# Patient Record
Sex: Female | Born: 1965 | Hispanic: No | Marital: Married | Smoking: Never smoker
Health system: Southern US, Community
[De-identification: ages and names within clinical notes are randomized; demographics above are authoritative.]

## PROBLEM LIST (undated history)

## (undated) DIAGNOSIS — E119 Type 2 diabetes mellitus without complications: Secondary | ICD-10-CM

## (undated) DIAGNOSIS — R569 Unspecified convulsions: Secondary | ICD-10-CM

## (undated) DIAGNOSIS — D649 Anemia, unspecified: Secondary | ICD-10-CM

## (undated) DIAGNOSIS — E785 Hyperlipidemia, unspecified: Secondary | ICD-10-CM

## (undated) DIAGNOSIS — T7840XA Allergy, unspecified, initial encounter: Secondary | ICD-10-CM

## (undated) DIAGNOSIS — F329 Major depressive disorder, single episode, unspecified: Secondary | ICD-10-CM

## (undated) DIAGNOSIS — F32A Depression, unspecified: Secondary | ICD-10-CM

## (undated) HISTORY — PX: NO PAST SURGERIES: SHX2092

## (undated) HISTORY — DX: Type 2 diabetes mellitus without complications: E11.9

## (undated) HISTORY — DX: Hyperlipidemia, unspecified: E78.5

## (undated) HISTORY — DX: Allergy, unspecified, initial encounter: T78.40XA

## (undated) HISTORY — DX: Major depressive disorder, single episode, unspecified: F32.9

## (undated) HISTORY — DX: Unspecified convulsions: R56.9

## (undated) HISTORY — DX: Anemia, unspecified: D64.9

## (undated) HISTORY — DX: Depression, unspecified: F32.A

---

## 1998-06-19 ENCOUNTER — Encounter: Payer: Self-pay | Admitting: *Deleted

## 1998-06-19 ENCOUNTER — Ambulatory Visit (HOSPITAL_COMMUNITY): Admission: RE | Admit: 1998-06-19 | Discharge: 1998-06-19 | Payer: Self-pay | Admitting: *Deleted

## 2000-01-02 ENCOUNTER — Emergency Department (HOSPITAL_COMMUNITY): Admission: EM | Admit: 2000-01-02 | Discharge: 2000-01-02 | Payer: Self-pay | Admitting: Emergency Medicine

## 2000-01-02 ENCOUNTER — Encounter: Payer: Self-pay | Admitting: Emergency Medicine

## 2012-02-07 ENCOUNTER — Telehealth: Payer: Self-pay

## 2012-02-07 ENCOUNTER — Other Ambulatory Visit: Payer: Self-pay | Admitting: Family Medicine

## 2012-02-07 ENCOUNTER — Encounter: Payer: Self-pay | Admitting: Family Medicine

## 2012-02-07 ENCOUNTER — Ambulatory Visit (INDEPENDENT_AMBULATORY_CARE_PROVIDER_SITE_OTHER): Payer: BC Managed Care – PPO | Admitting: Family Medicine

## 2012-02-07 VITALS — BP 114/64 | HR 78 | Temp 98.1°F | Resp 16 | Ht 61.5 in | Wt 115.4 lb

## 2012-02-07 DIAGNOSIS — Z0000 Encounter for general adult medical examination without abnormal findings: Secondary | ICD-10-CM

## 2012-02-07 LAB — POCT UA - MICROSCOPIC ONLY
Bacteria, U Microscopic: NEGATIVE
Casts, Ur, LPF, POC: NEGATIVE
Crystals, Ur, HPF, POC: NEGATIVE
Mucus, UA: NEGATIVE
WBC, Ur, HPF, POC: NEGATIVE
Yeast, UA: NEGATIVE

## 2012-02-07 LAB — COMPREHENSIVE METABOLIC PANEL
ALT: 13 U/L (ref 0–35)
AST: 16 U/L (ref 0–37)
Albumin: 4.7 g/dL (ref 3.5–5.2)
Alkaline Phosphatase: 38 U/L — ABNORMAL LOW (ref 39–117)
BUN: 12 mg/dL (ref 6–23)
CO2: 25 mEq/L (ref 19–32)
Calcium: 9.7 mg/dL (ref 8.4–10.5)
Chloride: 101 mEq/L (ref 96–112)
Creat: 0.59 mg/dL (ref 0.50–1.10)
Glucose, Bld: 98 mg/dL (ref 70–99)
Potassium: 4.3 mEq/L (ref 3.5–5.3)
Sodium: 137 mEq/L (ref 135–145)
Total Bilirubin: 0.6 mg/dL (ref 0.3–1.2)
Total Protein: 8.2 g/dL (ref 6.0–8.3)

## 2012-02-07 LAB — LIPID PANEL
Cholesterol: 195 mg/dL (ref 0–200)
HDL: 51 mg/dL (ref >39)
LDL Cholesterol: 116 mg/dL — ABNORMAL HIGH (ref 0–99)
Total CHOL/HDL Ratio: 3.8 Ratio
Triglycerides: 139 mg/dL (ref <150)
VLDL: 28 mg/dL (ref 0–40)

## 2012-02-07 LAB — POCT CBC
Granulocyte percent: 47.6 %G (ref 37–80)
HCT, POC: 42.5 % (ref 37.7–47.9)
Hemoglobin: 12.6 g/dL (ref 12.2–16.2)
Lymph, poc: 2.7 (ref 0.6–3.4)
MCH, POC: 20.8 pg — AB (ref 27–31.2)
MCHC: 29.6 g/dL — AB (ref 31.8–35.4)
MCV: 70 fL — AB (ref 80–97)
MID (cbc): 0.4 (ref 0–0.9)
MPV: 7.8 fL (ref 0–99.8)
POC Granulocyte: 2.8 (ref 2–6.9)
POC LYMPH PERCENT: 45 %L (ref 10–50)
POC MID %: 7.4 %M (ref 0–12)
Platelet Count, POC: 390 10*3/uL (ref 142–424)
RBC: 6.07 M/uL — AB (ref 4.04–5.48)
RDW, POC: 14.9 %
WBC: 5.9 10*3/uL (ref 4.6–10.2)

## 2012-02-07 LAB — POCT URINALYSIS DIPSTICK
Bilirubin, UA: NEGATIVE
Glucose, UA: NEGATIVE
Ketones, UA: NEGATIVE
Leukocytes, UA: NEGATIVE
Nitrite, UA: NEGATIVE
Protein, UA: NEGATIVE
Spec Grav, UA: 1.015
Urobilinogen, UA: 0.2
pH, UA: 6

## 2012-02-07 LAB — VITAMIN B12: Vitamin B-12: 671 pg/mL (ref 211–911)

## 2012-02-07 LAB — TSH: TSH: 1.82 u[IU]/mL (ref 0.350–4.500)

## 2012-02-07 LAB — FOLATE: Folate: >20 ng/mL

## 2012-02-07 NOTE — Telephone Encounter (Signed)
Message copied by Johnnette Litter on Mon Feb 07, 2012  3:49 PM ------      Message from: Abbe Amsterdam C      Created: Mon Feb 07, 2012  3:42 PM       Can we please add a TSH, B12 and folate (microcytosis).  Thanks!

## 2012-02-07 NOTE — Progress Notes (Signed)
[facility] [address], [place] [address] [id number] [phone]  Date:  02/07/2012   Name:  Jodi Parker   DOB:  1966-06-17   MRN:  [id number]  PCP:  Tally Due, MD    Chief Complaint: Annual Exam   History of Present Illness:  Jodi Parker is a 46 y.o. very pleasant female patient who presents with the following:  Here today for a CPE.  She had a negative pap last year- no history of abnormal pap in the past.  She is here today with her adult daughter.  Her menses are regular, and she has no other concerns today.  See pink sheet.  She believes that she is UTD on her immunizations.  Mammogram last year- ok.    There is no problem list on file for this patient.   No past medical history on file.  No past surgical history on file.  History  Substance Use Topics  . Smoking status: Not on file  . Smokeless tobacco: Not on file  . Alcohol Use: Not on file    No family history on file.  No Known Allergies  Medication list has been reviewed and updated.  No current outpatient prescriptions on file prior to visit.    Review of Systems:  As per HPI- otherwise negative.   Physical Examination: Filed Vitals:   02/07/12 0824  BP: 114/64  Pulse: 78  Temp: 98.1 F (36.7 C)  Resp: 16   Filed Vitals:   02/07/12 0824  Height: 5' 1.5" (1.562 m)  Weight: 115 lb 6.4 oz (52.345 kg)   Body mass index is 21.45 kg/(m^2). Ideal Body Weight: Weight in (lb) to have BMI = 25: 134.2   GEN: WDWN, NAD, Non-toxic, A & O x 3 HEENT: Atraumatic, Normocephalic. Neck supple. No masses, No LAD.  TM and oropharynx wnl, PEERL, EOMI Ears and Nose: No external deformity. CV: RRR, No M/G/R. No JVD. No thrill. No extra heart sounds. PULM: CTA B, no wheezes, crackles, rhonchi. No retractions. No resp. distress. No accessory muscle use. Breast exam: wnl bilaterally, no masses or discharge ABD: S, NT, ND, +BS. No rebound. No HSM. EXTR: No c/c/e NEURO Normal gait.  Normal  DTR.   PSYCH: Normally interactive. Conversant. Not depressed or anxious appearing.  Calm demeanor.   Results for orders placed in visit on 02/07/12  POCT CBC      Component Value Range   WBC 5.9  4.6 - 10.2 K/uL   Lymph, poc 2.7  0.6 - 3.4   POC LYMPH PERCENT 45.0  10 - 50 %L   MID (cbc) 0.4  0 - 0.9   POC MID % 7.4  0 - 12 %M   POC Granulocyte 2.8  2 - 6.9   Granulocyte percent 47.6  37 - 80 %G   RBC 6.07 (*) 4.04 - 5.48 M/uL   Hemoglobin 12.6  12.2 - 16.2 g/dL   HCT, POC 29.5  62.1 - 47.9 %   MCV 70.0 (*) 80 - 97 fL   MCH, POC 20.8 (*) 27 - 31.2 pg   MCHC 29.6 (*) 31.8 - 35.4 g/dL   RDW, POC 30.8     Platelet Count, POC 390  142 - 424 K/uL   MPV 7.8  0 - 99.8 fL  POCT URINALYSIS DIPSTICK      Component Value Range   Color, UA yellow     Clarity, UA clear  Glucose, UA neg     Bilirubin, UA neg     Ketones, UA neg     Spec Grav, UA 1.015     Blood, UA trace-lysed     pH, UA 6.0     Protein, UA neg     Urobilinogen, UA 0.2     Nitrite, UA neg     Leukocytes, UA Negative    POCT UA - MICROSCOPIC ONLY      Component Value Range   WBC, Ur, HPF, POC neg     RBC, urine, microscopic 0-1     Bacteria, U Microscopic neg     Mucus, UA neg     Epithelial cells, urine per micros 0-1     Crystals, Ur, HPF, POC neg     Casts, Ur, LPF, POC neg     Yeast, UA neg      Assessment and Plan: 1. Physical exam, annual  POCT CBC, Comprehensive metabolic panel, Lipid panel, POCT urinalysis dipstick, POCT UA - Microscopic Only   CPE as above.  Await other labs for further follow- up.  Went over updated guidelines regarding pap frequency.  She will not have a pap this year.  Continue exercise and regular mammograms.   Caelen does not need contraception- she does not wish to use any at this time.    Abbe Amsterdam, MD

## 2012-02-07 NOTE — Telephone Encounter (Signed)
Spoke with Annice Pih at [facility]. Tests added.

## 2012-02-08 ENCOUNTER — Telehealth: Payer: Self-pay | Admitting: Radiology

## 2012-02-08 ENCOUNTER — Encounter: Payer: Self-pay | Admitting: Family Medicine

## 2012-02-08 LAB — FERRITIN: Ferritin: 47 ng/mL (ref 10–291)

## 2012-02-08 NOTE — Telephone Encounter (Signed)
Message copied by Marinus Maw on Tue Feb 08, 2012  1:48 PM ------      Message from: Abbe Amsterdam C      Created: Tue Feb 08, 2012  1:40 PM       Hi- I meant to order a ferritin, not a B12 and folate on this patient.  If it possible to doctor bill the B12 and folate? I do not want Ms. Hoheisel to have to pay for these.             Thanks so much            JC

## 2012-02-08 NOTE — Telephone Encounter (Signed)
Thanks a lot Tracy!

## 2012-02-08 NOTE — Telephone Encounter (Signed)
Spoke with Bonita Quin at [facility] and added on Ferritin.  Spoke with Billing Dept and had B12 and Folate Doctor Billed.

## 2012-02-10 ENCOUNTER — Encounter: Payer: Self-pay | Admitting: Family Medicine

## 2012-03-03 ENCOUNTER — Encounter: Payer: Self-pay | Admitting: Family Medicine

## 2013-04-20 ENCOUNTER — Ambulatory Visit (INDEPENDENT_AMBULATORY_CARE_PROVIDER_SITE_OTHER): Payer: BC Managed Care – PPO | Admitting: Internal Medicine

## 2013-04-20 ENCOUNTER — Other Ambulatory Visit: Payer: Self-pay | Admitting: Internal Medicine

## 2013-04-20 VITALS — BP 100/60 | HR 74 | Temp 97.4°F | Resp 16 | Ht 61.5 in | Wt 119.0 lb

## 2013-04-20 DIAGNOSIS — Z01419 Encounter for gynecological examination (general) (routine) without abnormal findings: Secondary | ICD-10-CM

## 2013-04-20 DIAGNOSIS — Z0000 Encounter for general adult medical examination without abnormal findings: Secondary | ICD-10-CM

## 2013-04-20 DIAGNOSIS — Z789 Other specified health status: Secondary | ICD-10-CM

## 2013-04-20 DIAGNOSIS — Z609 Problem related to social environment, unspecified: Secondary | ICD-10-CM

## 2013-04-20 LAB — POCT URINALYSIS DIPSTICK
Bilirubin, UA: NEGATIVE
Glucose, UA: NEGATIVE
Ketones, UA: NEGATIVE
Leukocytes, UA: NEGATIVE
Nitrite, UA: NEGATIVE

## 2013-04-20 LAB — COMPREHENSIVE METABOLIC PANEL WITH GFR
ALT: 18 U/L (ref 0–35)
AST: 18 U/L (ref 0–37)
Albumin: 4.5 g/dL (ref 3.5–5.2)
Alkaline Phosphatase: 47 U/L (ref 39–117)
BUN: 12 mg/dL (ref 6–23)
CO2: 25 meq/L (ref 19–32)
Calcium: 10.1 mg/dL (ref 8.4–10.5)
Chloride: 102 meq/L (ref 96–112)
Creat: 0.58 mg/dL (ref 0.50–1.10)
Glucose, Bld: 99 mg/dL (ref 70–99)
Potassium: 4.2 meq/L (ref 3.5–5.3)
Sodium: 139 meq/L (ref 135–145)
Total Bilirubin: 0.5 mg/dL (ref 0.3–1.2)
Total Protein: 8.5 g/dL — ABNORMAL HIGH (ref 6.0–8.3)

## 2013-04-20 LAB — LIPID PANEL
Cholesterol: 199 mg/dL (ref 0–200)
HDL: 48 mg/dL (ref >39)
Triglycerides: 144 mg/dL (ref <150)
VLDL: 29 mg/dL (ref 0–40)

## 2013-04-20 LAB — POCT UA - MICROSCOPIC ONLY: Casts, Ur, LPF, POC: NEGATIVE

## 2013-04-20 LAB — POCT CBC
Granulocyte percent: 43.1 %G (ref 37–80)
HCT, POC: 42.2 % (ref 37.7–47.9)
MCH, POC: 21.9 pg — AB (ref 27–31.2)
MCV: 70.5 fL — AB (ref 80–97)
RBC: 5.98 M/uL — AB (ref 4.04–5.48)
WBC: 6.7 10*3/uL (ref 4.6–10.2)

## 2013-04-20 LAB — IFOBT (OCCULT BLOOD): IFOBT: NEGATIVE

## 2013-04-20 NOTE — Progress Notes (Signed)
Subjective:    Patient ID: Jodi Parker, female    DOB: 1965/08/16, 47 y.o.   MRN: [id number]  Gynecologic Exam  See progress note by Dr. Perrin Maltese for history    Review of Systems     Objective:   Physical Exam  Constitutional: She is oriented to person, place, and time. She appears well-developed and well-nourished.  HENT:  Head: Normocephalic and atraumatic.  Right Ear: Hearing, tympanic membrane, external ear and ear canal normal.  Left Ear: Hearing, tympanic membrane, external ear and ear canal normal.  Mouth/Throat: Uvula is midline, oropharynx is clear and moist and mucous membranes are normal. No oropharyngeal exudate.  Eyes: Conjunctivae and EOM are normal.  Neck: Normal range of motion. Neck supple. No thyromegaly present.  Cardiovascular: Normal rate, regular rhythm and normal heart sounds.   Pulmonary/Chest: Effort normal and breath sounds normal.  Abdominal: Soft. Bowel sounds are normal. There is no tenderness. There is no rebound and no guarding.  Genitourinary: Rectum normal, vagina normal and uterus normal. Rectal exam shows no tenderness and anal tone normal. Guaiac negative stool. There is no rash on the right labia. There is no rash on the left labia. Cervix exhibits no motion tenderness. Right adnexum displays no mass, no tenderness and no fullness. Left adnexum displays no mass, no tenderness and no fullness. No vaginal discharge found.  Lymphadenopathy:    She has no cervical adenopathy.  Neurological: She is alert and oriented to person, place, and time.  Psychiatric: She has a normal mood and affect. Her behavior is normal. Judgment and thought content normal.     Results for orders placed in visit on 04/20/13  POCT CBC      Result Value Range   WBC 6.7  4.6 - 10.2 K/uL   Lymph, poc 3.4  0.6 - 3.4   POC LYMPH PERCENT 51.0 (*) 10 - 50 %L   MID (cbc) 0.4  0 - 0.9   POC MID % 5.9  0 - 12 %M   POC Granulocyte 2.9  2 - 6.9   Granulocyte percent 43.1  37 - 80 %G    RBC 5.98 (*) 4.04 - 5.48 M/uL   Hemoglobin 13.1  12.2 - 16.2 g/dL   HCT, POC 40.9  81.1 - 47.9 %   MCV 70.5 (*) 80 - 97 fL   MCH, POC 21.9 (*) 27 - 31.2 pg   MCHC 31.0 (*) 31.8 - 35.4 g/dL   RDW, POC 91.4     Platelet Count, POC 392  142 - 424 K/uL   MPV 7.7  0 - 99.8 fL  POCT URINE PREGNANCY      Result Value Range   Preg Test, Ur Negative    IFOBT (OCCULT BLOOD)      Result Value Range   IFOBT Negative    POCT URINALYSIS DIPSTICK      Result Value Range   Color, UA pale     Clarity, UA clear     Glucose, UA neg     Bilirubin, UA neg     Ketones, UA neg     Spec Grav, UA 1.010     Blood, UA neg     pH, UA 5.5     Protein, UA neg     Urobilinogen, UA 0.2     Nitrite, UA neg     Leukocytes, UA Negative    POCT UA - MICROSCOPIC ONLY      Result Value Range   WBC,  Ur, HPF, POC 0-1     RBC, urine, microscopic 3-5     Bacteria, U Microscopic 1+     Mucus, UA neg     Epithelial cells, urine per micros neg     Crystals, Ur, HPF, POC neg     Casts, Ur, LPF, POC neg     Yeast, UA neg           Assessment & Plan:  Routine general medical examination at a health care facility - Plan: POCT CBC, POCT urine pregnancy, IFOBT POC (occult bld, rslt in office), POCT urinalysis dipstick, POCT UA - Microscopic Only, EKG 12-Lead  Routine gynecological examination - Plan: Pap IG w/ reflex to HPV when ASC-U  Language barrier  Labs pending Anticipatory guidance.  Mammogram self-referral.  Follow up in 1 year or sooner as needed.

## 2013-04-20 NOTE — Progress Notes (Signed)
  Subjective:    Patient ID: Jodi Parker, female    DOB: Mar 03, 1966, 47 y.o.   MRN: [id number]  HPI Feels great, has no problems, on no meds. Had IUD removed after 46yrs recently, using no BC. Requests pap smear, wants female care giver, had flu shot at work. Interpretor her daughter, born in [place].   Review of Systems  Constitutional: Negative.   HENT: Negative.   Eyes: Negative.   Respiratory: Negative.   Cardiovascular: Negative.   Gastrointestinal: Negative.   Endocrine: Negative.   Genitourinary: Negative.   Musculoskeletal: Negative.   Skin: Negative.   Allergic/Immunologic: Negative.   Neurological: Negative.   Hematological: Negative.   Psychiatric/Behavioral: Negative.        Objective:   Physical Exam  Vitals reviewed.    Ekg     Assessment & Plan:

## 2013-04-20 NOTE — Patient Instructions (Signed)
Immunization Schedule, Adult  Influenza vaccine.  Adults should be given 1 dose every year.  Tetanus, diphtheria, and pertussis (Td, Tdap) vaccine.  Adults who have not previously been given Tdap or who do not know their vaccine status should be given 1 dose of Tdap.  Adults should have a Td booster every 10 years.  Doses should be given if needed to catch up on missed doses in the past.  Pregnant women should be given 1 dose of Tdap vaccine during each pregnancy.  Varicella vaccine.  All adults without evidence of immunity to varicella should receive 2 doses or a second dose if they have received only 1 dose.  Pregnant women who do not have evidence of immunity should be given the first dose after their pregnancy.  Human papillomavirus (HPV) vaccine.  Women aged 13 through 26 years who have not been given the vaccine previously should be given the 3 dose series. The second dose should be given 1 to 2 months after the first dose. The third dose should be given at least 24 weeks after the first dose.  The vaccine is not recommended for use in pregnant women. However, pregnancy testing is not needed before being given a dose. If a woman is found to be pregnant after being given a dose, no treatment is needed. In that case, the remaining doses should be delayed until after the pregnancy.  Men aged 13 through 21 years who have not been given the vaccine previously should be given the 3 dose series. Men aged 22 through 26 years may be given the 3 dose series. The second dose should be given 1 to 2 months after the first dose. The third dose should be given at least 24 weeks after the first dose.  Zoster vaccine.  One dose is recommended for adults aged 60 years and older unless certain conditions are present.  Measles, mumps, and rubella (MMR) vaccine.  Adults born before 1957 generally are considered immune to measles and mumps. Healthcare workers born before 1957 who do not have  evidence of immunity should consider vaccination.  Adults born in 1957 or later should have 1 or more doses of MMR vaccine unless there is a contraindication for the vaccine or they have evidence of immunity to the diseases. A second dose should be given at least 28 days after the first dose. Adults receiving certain types of previous vaccines should consider or be given vaccine doses.  For women of childbearing age, rubella immunity should be determined. If there is no evidence of immunity, women who are not pregnant should be vaccinated. If there is no evidence of immunity, women who are pregnant should delay vaccination until after their pregnancy.  Pneumococcal polysaccharide (PPSV23) vaccine.  All adults aged 65 years and older should be given 1 dose.  Adults younger than age 65 years who have certain medical conditions, who smoke cigarettes, who reside in nursing homes or long-term care facilities, or who have an unknown vaccination history should usually be given 1 or 2 doses of the vaccine.  Pneumococcal 13-valent conjugate (PVC13) vaccine.  Adults aged 19 years or older with certain medical conditions and an unknown or incomplete pneumococcal vaccination history should usually be given 1 dose of the vaccine. This dose may be in addition to a PPSV23 vaccine dose.  Meningococcal vaccine.  First-year college students up to age 21 years who are living in residence halls should be given a dose if they did not receive a dose on   or after their 16th birthday.  A dose should be given to microbiologists working with certain meningitis bacteria, military recruits, and people who travel to or live in countries with a high rate of meningitis.  One or 2 doses should be given to adults who have certain high-risk conditions.  Hepatitis A vaccine.  Adults who wish to be protected from this disease, who have certain high-risk conditions, who work with hepatitis A-infected animals, who work in  hepatitis A research labs, or who travel to or work in countries with a high rate of hepatitis A should be given the 2 dose series of the vaccine.  Adults who were previously unvaccinated and who anticipate close contact with an international adoptee during the first 60 days after arrival in the [place] from a country with a high rate of hepatitis A should be given the vaccine. The first dose of the 2 dose series should be given 2 or more weeks before the arrival of the adoptee.  Hepatitis B vaccine.  Adults who wish to be protected from this disease, who have certain high-risk conditions, who may be exposed to blood or other infectious body fluids, who are household contacts or sex partners of hepatitis B positive people, who are clients or workers in certain care facilities, or who travel to or work in countries with a high rate of hepatitis B should be given the 3 dose series of the vaccine. If you travel outside the [place], additional vaccines may be needed. The Centers for Disease Control and Prevention (CDC) provides information about the vaccines, medicines, and other measures necessary to prevent illness and injury during international travel. Visit the CDC website at www.cdc.gov/travel or call (800) CDC-INFO [800-232-4636]. You may also consult a travel clinic or your caregiver. Document Released: 09/04/2003 Document Revised: 09/06/2011 Document Reviewed: 07/30/2011 ExitCare Patient Information 2014 [employer].  

## 2013-04-23 LAB — PAP IG W/ RFLX HPV ASCU

## 2013-04-24 LAB — PROTEIN ELECTROPHORESIS, SERUM
Albumin ELP: 51.8 % — ABNORMAL LOW (ref 55.8–66.1)
Alpha-1-Globulin: 5.9 % — ABNORMAL HIGH (ref 2.9–4.9)
Alpha-2-Globulin: 9.8 % (ref 7.1–11.8)
Beta 2: 4.2 % (ref 3.2–6.5)
Gamma Globulin: 21.4 % — ABNORMAL HIGH (ref 11.1–18.8)

## 2013-04-28 ENCOUNTER — Telehealth: Payer: Self-pay

## 2013-04-28 NOTE — Telephone Encounter (Signed)
Pt had CPE last week and she was wondering if her lab results were ok.  Please call [phone]

## 2013-04-29 NOTE — Telephone Encounter (Signed)
Pt notified that labs were normal

## 2014-05-06 ENCOUNTER — Ambulatory Visit: Payer: BC Managed Care – PPO | Admitting: Family Medicine

## 2014-05-27 ENCOUNTER — Ambulatory Visit: Payer: BC Managed Care – PPO | Admitting: Family Medicine

## 2014-06-27 ENCOUNTER — Ambulatory Visit (INDEPENDENT_AMBULATORY_CARE_PROVIDER_SITE_OTHER): Payer: BC Managed Care – PPO

## 2014-06-27 ENCOUNTER — Ambulatory Visit (INDEPENDENT_AMBULATORY_CARE_PROVIDER_SITE_OTHER): Payer: BC Managed Care – PPO | Admitting: Family Medicine

## 2014-06-27 VITALS — BP 100/60 | HR 83 | Temp 98.4°F | Resp 16 | Ht 61.5 in | Wt 119.0 lb

## 2014-06-27 DIAGNOSIS — M545 Low back pain, unspecified: Secondary | ICD-10-CM

## 2014-06-27 DIAGNOSIS — Z1329 Encounter for screening for other suspected endocrine disorder: Secondary | ICD-10-CM

## 2014-06-27 DIAGNOSIS — Z13220 Encounter for screening for lipoid disorders: Secondary | ICD-10-CM

## 2014-06-27 DIAGNOSIS — Z131 Encounter for screening for diabetes mellitus: Secondary | ICD-10-CM

## 2014-06-27 DIAGNOSIS — Z0000 Encounter for general adult medical examination without abnormal findings: Secondary | ICD-10-CM

## 2014-06-27 DIAGNOSIS — Z01419 Encounter for gynecological examination (general) (routine) without abnormal findings: Secondary | ICD-10-CM

## 2014-06-27 LAB — POCT URINALYSIS DIPSTICK
BILIRUBIN UA: NEGATIVE
Glucose, UA: NEGATIVE
KETONES UA: NEGATIVE
LEUKOCYTES UA: NEGATIVE
NITRITE UA: NEGATIVE
Protein, UA: NEGATIVE
Spec Grav, UA: <=1.005
UROBILINOGEN UA: 0.2
pH, UA: 5.5

## 2014-06-27 LAB — LIPID PANEL
CHOLESTEROL: 237 mg/dL — AB (ref 0–200)
HDL: 46 mg/dL (ref >39)
LDL Cholesterol: 147 mg/dL — ABNORMAL HIGH (ref 0–99)
Total CHOL/HDL Ratio: 5.2 Ratio
Triglycerides: 218 mg/dL — ABNORMAL HIGH (ref <150)
VLDL: 44 mg/dL — ABNORMAL HIGH (ref 0–40)

## 2014-06-27 LAB — CBC WITH DIFFERENTIAL/PLATELET
BASOS PCT: 1 % (ref 0–1)
Basophils Absolute: 0.1 10*3/uL (ref 0.0–0.1)
Eosinophils Absolute: 0.1 10*3/uL (ref 0.0–0.7)
Eosinophils Relative: 2 % (ref 0–5)
HCT: 40.2 % (ref 36.0–46.0)
Hemoglobin: 13.1 g/dL (ref 12.0–15.0)
LYMPHS PCT: 42 % (ref 12–46)
Lymphs Abs: 3 10*3/uL (ref 0.7–4.0)
MCH: 21.3 pg — ABNORMAL LOW (ref 26.0–34.0)
MCHC: 32.6 g/dL (ref 30.0–36.0)
MCV: 65.3 fL — ABNORMAL LOW (ref 78.0–100.0)
MPV: 8.5 fL — ABNORMAL LOW (ref 8.6–12.4)
Monocytes Absolute: 0.4 10*3/uL (ref 0.1–1.0)
Monocytes Relative: 6 % (ref 3–12)
NEUTROS ABS: 3.5 10*3/uL (ref 1.7–7.7)
Neutrophils Relative %: 49 % (ref 43–77)
Platelets: 334 10*3/uL (ref 150–400)
RBC: 6.16 MIL/uL — ABNORMAL HIGH (ref 3.87–5.11)
RDW: 15.3 % (ref 11.5–15.5)
WBC: 7.1 10*3/uL (ref 4.0–10.5)

## 2014-06-27 LAB — COMPREHENSIVE METABOLIC PANEL
ALT: 21 U/L (ref 0–35)
AST: 21 U/L (ref 0–37)
Albumin: 4.4 g/dL (ref 3.5–5.2)
Alkaline Phosphatase: 51 U/L (ref 39–117)
BILIRUBIN TOTAL: 0.5 mg/dL (ref 0.2–1.2)
BUN: 9 mg/dL (ref 6–23)
CHLORIDE: 102 meq/L (ref 96–112)
CO2: 26 meq/L (ref 19–32)
Calcium: 9.5 mg/dL (ref 8.4–10.5)
Creat: 0.54 mg/dL (ref 0.50–1.10)
GLUCOSE: 93 mg/dL (ref 70–99)
Potassium: 4 mEq/L (ref 3.5–5.3)
Sodium: 138 mEq/L (ref 135–145)
Total Protein: 8.3 g/dL (ref 6.0–8.3)

## 2014-06-27 LAB — HEMOGLOBIN A1C
Hgb A1c MFr Bld: 6.3 % — ABNORMAL HIGH (ref <5.7)
Mean Plasma Glucose: 134 mg/dL — ABNORMAL HIGH (ref <117)

## 2014-06-27 MED ORDER — MELOXICAM 15 MG PO TABS: 15.0000 mg | ORAL_TABLET | Freq: Every day | ORAL | Status: DC

## 2014-06-27 NOTE — Patient Instructions (Signed)

## 2014-06-27 NOTE — Progress Notes (Addendum)
Subjective:  This chart was scribed for Ethelda ChickKristi M Edmundo Tedesco, MD by Charline BillsEssence Howell, ED Scribe. The patient was seen in room 14. Patient's care was started at 1:41 PM.   Patient ID: Jodi Parker, female    DOB: 1966-05-14, 48 y.o.   MRN: [id number]  06/27/2014  Annual Exam  HPI HPI Comments: Jodi Parker is a 48 y.o. female who presents to the [facility] for an annual exam.    Last physical:  04/20/2013 Pap smear: 04/20/2013 WNL; no HPV obtained; +endometrial cells present.  LMP eight months ago.  Mammogram:  2012; desires appointment after 3:30pm. Colonoscopy:  never Bone density:  never TDAP:  2012 Influenza:  03/2014 employment Eye exam:  Never;  Dental exam:  This month.    Low back pain:  Onset one year ago.  Middle and B pain.  No radiation into legs.  No n/t/w.  Normal b/b function.    No daily pain.  Wants xray. Occurs when rains or change in weather.  Takes Ibuprofen for pain.  Takes Ibuprofen PRN.  Minimal relief with Ibuprofen..  Rare lifting at work.   Review of Systems  Constitutional: Negative for fever, chills, diaphoresis, activity change, appetite change, fatigue and unexpected weight change.  HENT: Negative for congestion, dental problem, drooling, ear discharge, ear pain, facial swelling, hearing loss, mouth sores, nosebleeds, postnasal drip, rhinorrhea, sinus pressure, sneezing, sore throat, tinnitus, trouble swallowing and voice change.   Eyes: Negative for photophobia, pain, discharge, redness, itching and visual disturbance.  Respiratory: Negative for apnea, cough, choking, chest tightness, shortness of breath, wheezing and stridor.   Cardiovascular: Negative for chest pain, palpitations and leg swelling.  Gastrointestinal: Negative for nausea, vomiting, abdominal pain, diarrhea, constipation, blood in stool, abdominal distention, anal bleeding and rectal pain.  Endocrine: Negative for cold intolerance, heat intolerance, polydipsia, polyphagia and  polyuria.  Genitourinary: Negative for dysuria, urgency, frequency, hematuria, flank pain, decreased urine volume, vaginal bleeding, vaginal discharge, enuresis, difficulty urinating, genital sores, vaginal pain, menstrual problem, pelvic pain and dyspareunia.  Musculoskeletal: Positive for back pain. Negative for myalgias, joint swelling, arthralgias, gait problem, neck pain and neck stiffness.  Skin: Negative for color change, pallor, rash and wound.  Allergic/Immunologic: Negative for environmental allergies, food allergies and immunocompromised state.  Neurological: Negative for dizziness, tremors, seizures, syncope, facial asymmetry, speech difficulty, weakness, light-headedness, numbness and headaches.  Hematological: Negative for adenopathy. Does not bruise/bleed easily.  Psychiatric/Behavioral: Negative for suicidal ideas, hallucinations, behavioral problems, confusion, sleep disturbance, self-injury, dysphoric mood, decreased concentration and agitation. The patient is not nervous/anxious and is not hyperactive.     Past Medical History  Diagnosis Date  . Allergy     Claritin PRN.   History reviewed. No pertinent past surgical history. No Known Allergies Current Outpatient Prescriptions  Medication Sig Dispense Refill  . cholecalciferol (VITAMIN D) 1000 UNITS tablet Take 1,000 Units by mouth daily.    . fish oil-omega-3 fatty acids 1000 MG capsule Take 1 g by mouth 2 (two) times daily.     No current facility-administered medications for this visit.   History   Social History  . Marital Status: Married    Spouse Name: N/A    Number of Children: N/A  . Years of Education: N/A   Occupational History  . Not on file.   Social History Main Topics  . Smoking status: Never Smoker   . Smokeless tobacco: Not on file  . Alcohol Use: Not on file  .  Drug Use: Not on file  . Sexual Activity: Yes   Other Topics Concern  . Not on file   Social History Narrative   Marital  status: married x 27 years; from [place]; moved to [place] 20 years ago.      Children: 4 children; no grandchildren.       Lives: with husband, 1 daughter.      Employment: works for [employer] x 20 years.      Tobacco; none      Alcohol: none; rare wine.      Drugs: none      Exercise:  Yes; walking daily      Seatbelt: 100%; does not drive.      Guns:  None       History reviewed. No pertinent family history.     Objective:    BP 100/60 mmHg  Pulse 83  Temp(Src) 98.4 F (36.9 C) (Oral)  Resp 16  Ht 5' 1.5" (1.562 m)  Wt 119 lb (53.978 kg)  BMI 22.12 kg/m2  SpO2 99% Physical Exam  Constitutional: She is oriented to person, place, and time. She appears well-developed and well-nourished. No distress.  HENT:  Head: Normocephalic and atraumatic.  Right Ear: External ear normal.  Left Ear: External ear normal.  Nose: Nose normal.  Mouth/Throat: Oropharynx is clear and moist.  Eyes: Conjunctivae and EOM are normal. Pupils are equal, round, and reactive to light.  Neck: Normal range of motion and full passive range of motion without pain. Neck supple. No JVD present. Carotid bruit is not present. No thyromegaly present.  Cardiovascular: Normal rate, regular rhythm and normal heart sounds.  Exam reveals no gallop and no friction rub.   No murmur heard. Pulmonary/Chest: Effort normal and breath sounds normal. She has no wheezes. She has no rales. Right breast exhibits no inverted nipple, no mass, no nipple discharge, no skin change and no tenderness. Left breast exhibits no inverted nipple, no mass, no nipple discharge, no skin change and no tenderness. Breasts are symmetrical.  Abdominal: Soft. Bowel sounds are normal. She exhibits no distension and no mass. There is no tenderness. There is no rebound and no guarding.  Genitourinary: Rectum normal, vagina normal and uterus normal. There is no rash, tenderness or lesion on the right labia. There is no rash, tenderness or  lesion on the left labia. Cervix exhibits no motion tenderness. Right adnexum displays no mass, no tenderness and no fullness. Left adnexum displays no mass, no tenderness and no fullness.  Musculoskeletal: Normal range of motion.       Right shoulder: Normal.       Left shoulder: Normal.       Cervical back: Normal.       Lumbar back: She exhibits pain. She exhibits normal range of motion, no tenderness, no bony tenderness, no swelling and no spasm.  Lumbar spine:  Non-tender midline; non-tender paraspinal regions B.  Straight leg raises negative B; toe and heel walking intact; marching intact; motor 5/5 BLE.  Full ROM lumbar spine without limitation.   Lymphadenopathy:    She has no cervical adenopathy.  Neurological: She is alert and oriented to person, place, and time. She has normal reflexes. No cranial nerve deficit. She exhibits normal muscle tone. Coordination normal.  Skin: Skin is warm and dry. No rash noted. She is not diaphoretic. No erythema. No pallor.  Psychiatric: She has a normal mood and affect. Her behavior is normal. Judgment and thought content normal.  Nursing  note and vitals reviewed.  Results for orders placed or performed in visit on 06/27/14  POCT urinalysis dipstick  Result Value Ref Range   Color, UA yellow    Clarity, UA clear    Glucose, UA neg    Bilirubin, UA neg    Ketones, UA neg    Spec Grav, UA <=1.005    Blood, UA trace-intact    pH, UA 5.5    Protein, UA neg    Urobilinogen, UA 0.2    Nitrite, UA neg    Leukocytes, UA Negative     [facility] reading (PRIMARY) by  Dr. Katrinka Blazing.  Lumbar spine films:  +spurring multilevels.      Assessment & Plan:   1. Encounter for routine gynecological examination   2. Annual physical exam   3. Screening, lipid   4. Screening for diabetes mellitus   5. Screening for thyroid disorder   6. Bilateral low back pain without sciatica      1. Complete Physical Examination: anticipatory guidance ---- exercise, weight  maintenance, 3 servings of dairy daily.  Pap smear obtained.  Refer for mammogram.  Perimenopausal.  Immunizations reviewed and UTD.   2. Gynecological exam: pap smear obtained; refer for mammogram. Perimenopausal; LMP eight months ago.   3.  Screening lipid: obtain FLP. 4. Screening DMII: obtain glucose, HgbA1c. 5.  Screening thyroid: obtain TSH. 6.  Lower back pain: New.  Rx for Meloxicam provided to use PRN. Recommend rest, avoid heavy lifting, stretches. Home exercise program provided to perform daily. If no improvement on month, call for PT referral.    No orders of the defined types were placed in this encounter.    No Follow-up on file.   I personally performed the services described in this documentation, which was scribed in my presence. The recorded information has been reviewed and considered.   Kristl Morioka Paulita Fujita, M.D. [facility] [address] [place], [place]  [address] [phone] phone [phone] fax

## 2014-06-28 LAB — TSH: TSH: 2.272 u[IU]/mL (ref 0.350–4.500)

## 2014-07-01 LAB — PAP IG AND HPV HIGH-RISK: HPV DNA High Risk: NOT DETECTED

## 2014-07-02 ENCOUNTER — Telehealth: Payer: Self-pay

## 2014-07-04 NOTE — Telephone Encounter (Signed)
A user error has taken place: encounter opened in error, closed for administrative reasons.

## 2014-07-08 ENCOUNTER — Ambulatory Visit: Payer: BC Managed Care – PPO | Admitting: Family Medicine

## 2014-07-16 ENCOUNTER — Other Ambulatory Visit: Payer: Self-pay | Admitting: Family Medicine

## 2014-07-16 DIAGNOSIS — Z1231 Encounter for screening mammogram for malignant neoplasm of breast: Secondary | ICD-10-CM

## 2014-08-02 ENCOUNTER — Ambulatory Visit
Admission: RE | Admit: 2014-08-02 | Discharge: 2014-08-02 | Disposition: A | Discharge status: Home / Self Care | Payer: BLUE CROSS/BLUE SHIELD | Source: Ambulatory Visit | Attending: Family Medicine | Admitting: Family Medicine

## 2014-08-02 DIAGNOSIS — Z1231 Encounter for screening mammogram for malignant neoplasm of breast: Secondary | ICD-10-CM

## 2015-07-04 ENCOUNTER — Encounter: Payer: Self-pay | Admitting: Family Medicine

## 2015-08-29 ENCOUNTER — Encounter: Payer: Self-pay | Admitting: Family Medicine

## 2015-09-09 ENCOUNTER — Encounter: Payer: Self-pay | Admitting: Family Medicine

## 2015-09-09 ENCOUNTER — Ambulatory Visit (INDEPENDENT_AMBULATORY_CARE_PROVIDER_SITE_OTHER): Payer: BLUE CROSS/BLUE SHIELD | Admitting: Family Medicine

## 2015-09-09 VITALS — BP 114/76 | HR 76 | Temp 97.9°F | Resp 20 | Ht 61.5 in | Wt 123.2 lb

## 2015-09-09 DIAGNOSIS — Z1329 Encounter for screening for other suspected endocrine disorder: Secondary | ICD-10-CM | POA: Diagnosis not present

## 2015-09-09 DIAGNOSIS — Z0000 Encounter for general adult medical examination without abnormal findings: Secondary | ICD-10-CM | POA: Diagnosis not present

## 2015-09-09 DIAGNOSIS — Z1231 Encounter for screening mammogram for malignant neoplasm of breast: Secondary | ICD-10-CM

## 2015-09-09 DIAGNOSIS — Z13220 Encounter for screening for lipoid disorders: Secondary | ICD-10-CM

## 2015-09-09 DIAGNOSIS — Z124 Encounter for screening for malignant neoplasm of cervix: Secondary | ICD-10-CM | POA: Diagnosis not present

## 2015-09-09 DIAGNOSIS — Z114 Encounter for screening for human immunodeficiency virus [HIV]: Secondary | ICD-10-CM | POA: Diagnosis not present

## 2015-09-09 DIAGNOSIS — Z1211 Encounter for screening for malignant neoplasm of colon: Secondary | ICD-10-CM | POA: Diagnosis not present

## 2015-09-09 DIAGNOSIS — Z131 Encounter for screening for diabetes mellitus: Secondary | ICD-10-CM

## 2015-09-09 LAB — COMPREHENSIVE METABOLIC PANEL
ALT: 39 U/L — ABNORMAL HIGH (ref 6–29)
AST: 25 U/L (ref 10–35)
Albumin: 4.2 g/dL (ref 3.6–5.1)
Alkaline Phosphatase: 50 U/L (ref 33–130)
BUN: 8 mg/dL (ref 7–25)
CALCIUM: 9.6 mg/dL (ref 8.6–10.4)
CHLORIDE: 101 mmol/L (ref 98–110)
CO2: 23 mmol/L (ref 20–31)
CREATININE: 0.46 mg/dL — AB (ref 0.50–1.05)
Glucose, Bld: 92 mg/dL (ref 65–99)
Potassium: 3.8 mmol/L (ref 3.5–5.3)
SODIUM: 139 mmol/L (ref 135–146)
TOTAL PROTEIN: 7.3 g/dL (ref 6.1–8.1)
Total Bilirubin: 0.5 mg/dL (ref 0.2–1.2)

## 2015-09-09 LAB — CBC WITH DIFFERENTIAL/PLATELET
BASOS PCT: 1 % (ref 0–1)
Basophils Absolute: 0.1 10*3/uL (ref 0.0–0.1)
Eosinophils Absolute: 0.1 10*3/uL (ref 0.0–0.7)
Eosinophils Relative: 1 % (ref 0–5)
HCT: 38.1 % (ref 36.0–46.0)
HEMOGLOBIN: 12.4 g/dL (ref 12.0–15.0)
LYMPHS ABS: 2.5 10*3/uL (ref 0.7–4.0)
LYMPHS PCT: 36 % (ref 12–46)
MCH: 21.9 pg — ABNORMAL LOW (ref 26.0–34.0)
MCHC: 32.5 g/dL (ref 30.0–36.0)
MCV: 67.2 fL — AB (ref 78.0–100.0)
MONO ABS: 0.4 10*3/uL (ref 0.1–1.0)
MONOS PCT: 6 % (ref 3–12)
MPV: 8.4 fL — ABNORMAL LOW (ref 8.6–12.4)
NEUTROS ABS: 3.9 10*3/uL (ref 1.7–7.7)
NEUTROS PCT: 56 % (ref 43–77)
Platelets: 252 10*3/uL (ref 150–400)
RBC: 5.67 MIL/uL — ABNORMAL HIGH (ref 3.87–5.11)
RDW: 16.7 % — ABNORMAL HIGH (ref 11.5–15.5)
WBC: 7 10*3/uL (ref 4.0–10.5)

## 2015-09-09 LAB — LIPID PANEL
CHOLESTEROL: 265 mg/dL — AB (ref 125–200)
HDL: 56 mg/dL (ref >=46)
LDL Cholesterol: 167 mg/dL — ABNORMAL HIGH (ref <130)
Total CHOL/HDL Ratio: 4.7 Ratio (ref <=5.0)
Triglycerides: 211 mg/dL — ABNORMAL HIGH (ref <150)
VLDL: 42 mg/dL — ABNORMAL HIGH (ref <30)

## 2015-09-09 NOTE — Patient Instructions (Addendum)
IF you received an x-ray today, you will receive an invoice from [facility]. Please contact [facility] at [phone] with questions or concerns regarding your invoice.   IF you received labwork today, you will receive an invoice from [facility]. Please contact [facility] at [phone] with questions or concerns regarding your invoice.   Our billing staff will not be able to assist you with questions regarding bills from these companies.  You will be contacted with the lab results as soon as they are available. The fastest way to get your results is to activate your My Chart account. Instructions are located on the last page of this paperwork. If you have not heard from us regarding the results in 2 weeks, please contact this office.   Colorectal Cancer Screening Colorectal cancer screening is a group of tests used to check for colorectal cancer. Colorectal refers to your colon and rectum. Your colon and rectum are located at the end of your large intestine and carry your bowel movements out of your body.  WHY IS COLORECTAL CANCER SCREENING DONE? It is common for abnormal growths (polyps) to form in the lining of your colon, especially as you get older. These polyps can be cancerous or become cancerous. If colorectal cancer is found at an early stage, it is treatable. WHO SHOULD BE SCREENED FOR COLORECTAL CANCER? Screening is recommended for all adults at average risk starting at age 51. Tests may be recommended every 1 to 10 years. Your health care provider may recommend earlier or more frequent screening if you have:  A history of colorectal cancer or polyps.  A family member with a history of colorectal cancer or polyps.  Inflammatory bowel disease, such as ulcerative colitis or Crohn disease.  A type of hereditary colon cancer syndrome.  Colorectal cancer symptoms. TYPES OF SCREENING TESTS There are several types of colorectal screening  tests. They include:  Guaiac-based fecal occult blood testing.  Fecal immunochemical test (FIT).  Stool DNA test.  Barium enema.  Virtual colonoscopy.  Sigmoidoscopy. During this test, a sigmoidoscope is used to examine your rectum and lower colon. A sigmoidoscope is a flexible tube with a camera that is inserted through your anus into your rectum and lower colon.  Colonoscopy. During this test, a colonoscope is used to examine your entire colon. A colonoscope is a long, thin, flexible tube with a camera. This test examines your entire colon and rectum.   This information is not intended to replace advice given to you by your health care provider. Make sure you discuss any questions you have with your health care provider.   Document Released: 12/02/2009 Document Revised: 07/05/2014 Document Reviewed: 09/20/2013 Elsevier Interactive Patient Education 2016 [employer] Keeping You Healthy  Get These Tests  Blood Pressure- Have your blood pressure checked by your healthcare provider at least once a year.  Normal blood pressure is 120/80.  Weight- Have your body mass index (BMI) calculated to screen for obesity.  BMI is a measure of body fat based on height and weight.  You can calculate your own BMI at https://www.west-esparza.com/www.nhlbisupport.com/bmi/  Cholesterol- Have your cholesterol checked every year.  Diabetes- Have your blood sugar checked every year if you have high blood pressure, high cholesterol, a family history of diabetes or if you are overweight.  Pap Test - Have a pap test every 1 to 5 years if you have been sexually active.  If you are older than 65 and recent pap tests have been normal  you may not need additional pap tests.  In addition, if you have had a hysterectomy  for benign disease additional pap tests are not necessary.  Mammogram-Yearly mammograms are essential for early detection of breast cancer  Screening for Colon Cancer- Colonoscopy starting at age 51. Screening may begin  sooner depending on your family history and other health conditions.  Follow up colonoscopy as directed by your Gastroenterologist.  Screening for Osteoporosis- Screening begins at age 51 with bone density scanning, sooner if you are at higher risk for developing Osteoporosis.  Get these medicines  Calcium with Vitamin D- Your body requires 1200-1500 mg of Calcium a day and [phone] IU of Vitamin D a day.  You can only absorb 500 mg of Calcium at a time therefore Calcium must be taken in 2 or 3 separate doses throughout the day.  Hormones- Hormone therapy has been associated with increased risk for certain cancers and heart disease.  Talk to your healthcare provider about if you need relief from menopausal symptoms.  Aspirin- Ask your healthcare provider about taking Aspirin to prevent Heart Disease and Stroke.  Get these Immuniztions  Flu shot- Every fall  Pneumonia shot- Once after the age of 51; if you are younger ask your healthcare provider if you need a pneumonia shot.  Tetanus- Every ten years.  Zostavax- Once after the age of 51 to prevent shingles.  Take these steps  Don't smoke- Your healthcare provider can help you quit. For tips on how to quit, ask your healthcare provider or go to www.smokefree.gov or call 1-800 QUIT-NOW.  Be physically active- Exercise 5 days a week for a minimum of 30 minutes.  If you are not already physically active, start slow and gradually work up to 30 minutes of moderate physical activity.  Try walking, dancing, bike riding, swimming, etc.  Eat a healthy diet- Eat a variety of healthy foods such as fruits, vegetables, whole grains, low fat milk, low fat cheeses, yogurt, lean meats, chicken, fish, eggs, dried beans, tofu, etc.  For more information go to www.thenutritionsource.org  Dental visit- Brush and floss teeth twice daily; visit your dentist twice a year.  Eye exam- Visit your Optometrist or Ophthalmologist yearly.  Drink alcohol in  moderation- Limit alcohol intake to one drink or less a day.  Never drink and drive.  Depression- Your emotional health is as important as your physical health.  If you're feeling down or losing interest in things you normally enjoy, please talk to your healthcare provider.  Seat Belts- can save your life; always wear one  Smoke/Carbon Monoxide detectors- These detectors need to be installed on the appropriate level of your home.  Replace batteries at least once a year.  Violence- If anyone is threatening or hurting you, please tell your healthcare provider.  Living Will/ Health care power of attorney- Discuss with your healthcare provider and family.

## 2015-09-09 NOTE — Progress Notes (Signed)
Subjective:    Patient ID: Jodi Parker, female    DOB: 1966-01-01, 50 y.o.   MRN: [id number]  09/09/2015  Annual Exam   HPI This 50 y.o. female presents for Complete Physical examination.  Last physical:  06-27-2014 Pap smear:  06-27-2014; LMP two years ago. Mammogram:  08-05-14 Colonoscopy: never TDAP:  2012 Influenza:  refuses Eye exam:  never Dental exam:  Every six months.    Review of Systems  Constitutional: Negative for fever, chills, diaphoresis, activity change, appetite change, fatigue and unexpected weight change.  HENT: Negative for congestion, dental problem, drooling, ear discharge, ear pain, facial swelling, hearing loss, mouth sores, nosebleeds, postnasal drip, rhinorrhea, sinus pressure, sneezing, sore throat, tinnitus, trouble swallowing and voice change.   Eyes: Negative for photophobia, pain, discharge, redness, itching and visual disturbance.  Respiratory: Negative for apnea, cough, choking, chest tightness, shortness of breath, wheezing and stridor.   Cardiovascular: Negative for chest pain, palpitations and leg swelling.  Gastrointestinal: Negative for nausea, vomiting, abdominal pain, diarrhea, constipation, blood in stool, abdominal distention, anal bleeding and rectal pain.  Endocrine: Negative for cold intolerance, heat intolerance, polydipsia, polyphagia and polyuria.  Genitourinary: Negative for dysuria, urgency, frequency, hematuria, flank pain, decreased urine volume, vaginal bleeding, vaginal discharge, enuresis, difficulty urinating, genital sores, vaginal pain, menstrual problem, pelvic pain and dyspareunia.  Musculoskeletal: Negative for myalgias, back pain, joint swelling, arthralgias, gait problem, neck pain and neck stiffness.  Skin: Negative for color change, pallor, rash and wound.  Allergic/Immunologic: Negative for environmental allergies, food allergies and immunocompromised state.  Neurological: Negative for dizziness, tremors, seizures,  syncope, facial asymmetry, speech difficulty, weakness, light-headedness, numbness and headaches.  Hematological: Negative for adenopathy. Does not bruise/bleed easily.  Psychiatric/Behavioral: Negative for suicidal ideas, hallucinations, behavioral problems, confusion, sleep disturbance, self-injury, dysphoric mood, decreased concentration and agitation. The patient is not nervous/anxious and is not hyperactive.     Past Medical History  Diagnosis Date  . Allergy     Claritin PRN.   History reviewed. No pertinent past surgical history. No Known Allergies Current Outpatient Prescriptions  Medication Sig Dispense Refill  . cholecalciferol (VITAMIN D) 1000 UNITS tablet Take 1,000 Units by mouth daily.    . fish oil-omega-3 fatty acids 1000 MG capsule Take 1 g by mouth 2 (two) times daily.    . meloxicam (MOBIC) 15 MG tablet Take 1 tablet (15 mg total) by mouth daily. 30 tablet 0   No current facility-administered medications for this visit.   Social History   Social History  . Marital Status: Married    Spouse Name: N/A  . Number of Children: N/A  . Years of Education: N/A   Occupational History  . Not on file.   Social History Main Topics  . Smoking status: Never Smoker   . Smokeless tobacco: Not on file  . Alcohol Use: 0.0 oz/week    0 Standard drinks or equivalent per week     Comment: wine social use  . Drug Use: No  . Sexual Activity: Yes   Other Topics Concern  . Not on file   Social History Narrative   Marital status: married x 29 years; from [place]; moved to [place] 22 years ago.      Children: 4 children; no grandchildren.       Lives: with husband, 2 daughters.      Employment: works for [employer] x 22 years.      Tobacco; none      Alcohol: wine 3 glasses  per week.      Drugs: none      Exercise:  Yes; walking daily for 30 minutes.      Seatbelt: 100%; does not drive.      Guns:  None      History reviewed. No pertinent family history.       Objective:    BP 114/76 mmHg  Pulse 76  Temp(Src) 97.9 F (36.6 C) (Oral)  Resp 20  Ht 5' 1.5" (1.562 m)  Wt 123 lb 4 oz (55.906 kg)  BMI 22.91 kg/m2  SpO2 97% Physical Exam  Constitutional: She is oriented to person, place, and time. She appears well-developed and well-nourished. No distress.  HENT:  Head: Normocephalic and atraumatic.  Right Ear: External ear normal.  Left Ear: External ear normal.  Nose: Nose normal.  Mouth/Throat: Oropharynx is clear and moist.  Eyes: Conjunctivae and EOM are normal. Pupils are equal, round, and reactive to light.  Neck: Normal range of motion and full passive range of motion without pain. Neck supple. No JVD present. Carotid bruit is not present. No thyromegaly present.  Cardiovascular: Normal rate, regular rhythm and normal heart sounds.  Exam reveals no gallop and no friction rub.   No murmur heard. Pulmonary/Chest: Effort normal and breath sounds normal. She has no wheezes. She has no rales.  Abdominal: Soft. Bowel sounds are normal. She exhibits no distension and no mass. There is no tenderness. There is no rebound and no guarding.  Musculoskeletal:       Right shoulder: Normal.       Left shoulder: Normal.       Cervical back: Normal.  Lymphadenopathy:    She has no cervical adenopathy.  Neurological: She is alert and oriented to person, place, and time. She has normal reflexes. No cranial nerve deficit. She exhibits normal muscle tone. Coordination normal.  Skin: Skin is warm and dry. No rash noted. She is not diaphoretic. No erythema. No pallor.  Psychiatric: She has a normal mood and affect. Her behavior is normal. Judgment and thought content normal.  Nursing note and vitals reviewed.       Assessment & Plan:   1. Routine physical examination   2. Colon cancer screening   3. Encounter for screening mammogram for breast cancer   4. Screening, lipid   5. Screening for diabetes mellitus   6. Cervical cancer screening   7.  Screening for HIV (human immunodeficiency virus)   8. Screening for thyroid disorder     Orders Placed This Encounter  Procedures  . CBC with Differential/Platelet  . Comprehensive metabolic panel    Order Specific Question:  Has the patient fasted?    Answer:  Yes  . Hemoglobin A1c  . HIV antibody  . Lipid panel    Order Specific Question:  Has the patient fasted?    Answer:  Yes  . TSH  . Ambulatory referral to Gastroenterology    Referral Priority:  Routine    Referral Type:  Consultation    Referral Reason:  Specialty Services Required    Number of Visits Requested:  1  . POCT urinalysis dipstick   No orders of the defined types were placed in this encounter.    No Follow-up on file.    Apollos Tenbrink Paulita Fujita, M.D. [facility] [address] [place], [place]  [address] [phone] phone [phone] fax

## 2015-09-10 LAB — HEMOGLOBIN A1C
Hgb A1c MFr Bld: 6.6 % — ABNORMAL HIGH (ref <5.7)
MEAN PLASMA GLUCOSE: 143 mg/dL — AB (ref <117)

## 2015-09-10 LAB — TSH: TSH: 1.63 m[IU]/L

## 2015-09-10 LAB — HIV ANTIBODY (ROUTINE TESTING W REFLEX): HIV: NONREACTIVE

## 2015-09-11 LAB — PAP IG AND HPV HIGH-RISK: HPV DNA HIGH RISK: NOT DETECTED

## 2015-09-15 ENCOUNTER — Other Ambulatory Visit: Payer: Self-pay | Admitting: Family Medicine

## 2015-09-15 DIAGNOSIS — Z1231 Encounter for screening mammogram for malignant neoplasm of breast: Secondary | ICD-10-CM

## 2015-10-01 ENCOUNTER — Ambulatory Visit
Admission: RE | Admit: 2015-10-01 | Discharge: 2015-10-01 | Disposition: A | Discharge status: Home / Self Care | Payer: BLUE CROSS/BLUE SHIELD | Source: Ambulatory Visit | Attending: Family Medicine | Admitting: Family Medicine

## 2015-10-01 DIAGNOSIS — Z1231 Encounter for screening mammogram for malignant neoplasm of breast: Secondary | ICD-10-CM

## 2015-10-05 ENCOUNTER — Ambulatory Visit: Payer: BLUE CROSS/BLUE SHIELD

## 2016-02-03 ENCOUNTER — Ambulatory Visit (INDEPENDENT_AMBULATORY_CARE_PROVIDER_SITE_OTHER): Payer: BLUE CROSS/BLUE SHIELD | Admitting: Urgent Care

## 2016-02-03 ENCOUNTER — Ambulatory Visit: Payer: BLUE CROSS/BLUE SHIELD | Admitting: Family Medicine

## 2016-02-03 VITALS — BP 110/70 | HR 78 | Temp 98.1°F | Resp 17 | Ht 62.0 in | Wt 120.0 lb

## 2016-02-03 DIAGNOSIS — E119 Type 2 diabetes mellitus without complications: Secondary | ICD-10-CM | POA: Diagnosis not present

## 2016-02-03 DIAGNOSIS — Z1211 Encounter for screening for malignant neoplasm of colon: Secondary | ICD-10-CM | POA: Diagnosis not present

## 2016-02-03 LAB — POCT GLYCOSYLATED HEMOGLOBIN (HGB A1C): Hemoglobin A1C: 6.5

## 2016-02-03 NOTE — Progress Notes (Signed)
    MRN: [id number] DOB: 1966-02-23  Subjective:   Jodi Parker is a 50 y.o. female presenting for follow up on diabetes.   At her last annual exam in 08/2015, patient was screened for diabetes, her A1c was 6.6. She is not on any medical therapy. Reports polydipsia, polyuria. Patient has tried to make dietary modifications. Walks every night. Denies blurred vision, chest pain, shob, n/v, abdominal pain, hematuria, skin infections, numbness or tingling. Denies smoking cigarettes, drinking alcohol.  Jodi Parker has a current medication list which includes the following prescription(s): cholecalciferol and fish oil-omega-3 fatty acids. Also has No Known Allergies.  Jodi Parker  has a past medical history of Allergy. Also  has no past surgical history on file.  Objective:   Vitals: BP 110/70 (BP Location: Right Arm, Patient Position: Sitting, Cuff Size: Normal)   Pulse 78   Temp 98.1 F (36.7 C) (Oral)   Resp 17   Ht 5\' 2"  (1.575 m)   Wt 120 lb (54.4 kg)   SpO2 98%   BMI 21.95 kg/m   Physical Exam  Constitutional: She is oriented to person, place, and time. She appears well-developed and well-nourished.  Cardiovascular: Normal rate, regular rhythm and intact distal pulses.  Exam reveals no gallop and no friction rub.   No murmur heard. Pulmonary/Chest: No respiratory distress. She has no wheezes. She has no rales.  Neurological: She is alert and oriented to person, place, and time.   Results for orders placed or performed in visit on 02/03/16 (from the past 24 hour(s))  POCT glycosylated hemoglobin (Hb A1C)     Status: None   Collection Time: 02/03/16  9:01 AM  Result Value Ref Range   Hemoglobin A1C 6.5    Assessment and Plan :   1. Type 2 diabetes mellitus without complication, without long-term current use of insulin (HCC) - Stable, reviewed diagnosis, management of diet. Rtc in 6 months for recheck.  2. Colon cancer screening - At the end of her visit, patient requested referral to GI for  colon cancer screening. This did not happen last time.   Wallis BambergMario Shakiyah Cirilo, PA-C [facility] [phone] 02/03/2016 8:43 AM

## 2016-02-03 NOTE — Patient Instructions (Addendum)
Diabetes Mellitus and Food It is important for you to manage your blood sugar (glucose) level. Your blood glucose level can be greatly affected by what you eat. Eating healthier foods in the appropriate amounts throughout the day at about the same time each day will help you control your blood glucose level. It can also help slow or prevent worsening of your diabetes mellitus. Healthy eating may even help you improve the level of your blood pressure and reach or maintain a healthy weight.  General recommendations for healthful eating and cooking habits include:  Eating meals and snacks regularly. Avoid going long periods of time without eating to lose weight.  Eating a diet that consists mainly of plant-based foods, such as fruits, vegetables, nuts, legumes, and whole grains.  Using low-heat cooking methods, such as baking, instead of high-heat cooking methods, such as deep frying. Work with your dietitian to make sure you understand how to use the Nutrition Facts information on food labels. HOW CAN FOOD AFFECT ME? Carbohydrates Carbohydrates affect your blood glucose level more than any other type of food. Your dietitian will help you determine how many carbohydrates to eat at each meal and teach you how to count carbohydrates. Counting carbohydrates is important to keep your blood glucose at a healthy level, especially if you are using insulin or taking certain medicines for diabetes mellitus. Alcohol Alcohol can cause sudden decreases in blood glucose (hypoglycemia), especially if you use insulin or take certain medicines for diabetes mellitus. Hypoglycemia can be a life-threatening condition. Symptoms of hypoglycemia (sleepiness, dizziness, and disorientation) are similar to symptoms of having too much alcohol.  If your health care provider has given you approval to drink alcohol, do so in moderation and use the following guidelines:  Women should not have more than one drink per day, and men  should not have more than two drinks per day. One drink is equal to:  12 oz of beer.  5 oz of wine.  1 oz of hard liquor.  Do not drink on an empty stomach.  Keep yourself hydrated. Have water, diet soda, or unsweetened iced tea.  Regular soda, juice, and other mixers might contain a lot of carbohydrates and should be counted. WHAT FOODS ARE NOT RECOMMENDED? As you make food choices, it is important to remember that all foods are not the same. Some foods have fewer nutrients per serving than other foods, even though they might have the same number of calories or carbohydrates. It is difficult to get your body what it needs when you eat foods with fewer nutrients. Examples of foods that you should avoid that are high in calories and carbohydrates but low in nutrients include:  Trans fats (most processed foods list trans fats on the Nutrition Facts label).  Regular soda.  Juice.  Candy.  Sweets, such as cake, pie, doughnuts, and cookies.  Fried foods. WHAT FOODS CAN I EAT? Eat nutrient-rich foods, which will nourish your body and keep you healthy. The food you should eat also will depend on several factors, including:  The calories you need.  The medicines you take.  Your weight.  Your blood glucose level.  Your blood pressure level.  Your cholesterol level. You should eat a variety of foods, including:  Protein.  Lean cuts of meat.  Proteins low in saturated fats, such as fish, egg whites, and beans. Avoid processed meats.  Fruits and vegetables.  Fruits and vegetables that may help control blood glucose levels, such as apples, mangoes, and   yams.  Dairy products.  Choose fat-free or low-fat dairy products, such as milk, yogurt, and cheese.  Grains, bread, pasta, and rice.  Choose whole grain products, such as multigrain bread, whole oats, and brown rice. These foods may help control blood pressure.  Fats.  Foods containing healthful fats, such as nuts,  avocado, olive oil, canola oil, and fish. DOES EVERYONE WITH DIABETES MELLITUS HAVE THE SAME MEAL PLAN? Because every person with diabetes mellitus is different, there is not one meal plan that works for everyone. It is very important that you meet with a dietitian who will help you create a meal plan that is just right for you.   This information is not intended to replace advice given to you by your health care provider. Make sure you discuss any questions you have with your health care provider.   Document Released: 03/11/2005 Document Revised: 07/05/2014 Document Reviewed: 05/11/2013 Elsevier Interactive Patient Education 2016 Elsevier Inc.     IF you received an x-ray today, you will receive an invoice from [facility]. Please contact [facility] at [phone] with questions or concerns regarding your invoice.   IF you received labwork today, you will receive an invoice from [facility]. Please contact [facility] at [phone] with questions or concerns regarding your invoice.   Our billing staff will not be able to assist you with questions regarding bills from these companies.  You will be contacted with the lab results as soon as they are available. The fastest way to get your results is to activate your My Chart account. Instructions are located on the last page of this paperwork. If you have not heard from us regarding the results in 2 weeks, please contact this office.      

## 2016-03-05 ENCOUNTER — Encounter: Payer: Self-pay | Admitting: Urgent Care

## 2017-01-07 ENCOUNTER — Encounter: Payer: BLUE CROSS/BLUE SHIELD | Admitting: Family Medicine

## 2017-01-14 ENCOUNTER — Ambulatory Visit (INDEPENDENT_AMBULATORY_CARE_PROVIDER_SITE_OTHER): Payer: BLUE CROSS/BLUE SHIELD | Admitting: Family Medicine

## 2017-01-14 ENCOUNTER — Encounter: Payer: Self-pay | Admitting: Family Medicine

## 2017-01-14 VITALS — BP 116/78 | HR 87 | Temp 98.0°F | Resp 18 | Ht 61.42 in | Wt 116.0 lb

## 2017-01-14 DIAGNOSIS — E119 Type 2 diabetes mellitus without complications: Secondary | ICD-10-CM | POA: Diagnosis not present

## 2017-01-14 DIAGNOSIS — Z23 Encounter for immunization: Secondary | ICD-10-CM

## 2017-01-14 DIAGNOSIS — Z1211 Encounter for screening for malignant neoplasm of colon: Secondary | ICD-10-CM

## 2017-01-14 DIAGNOSIS — Z0000 Encounter for general adult medical examination without abnormal findings: Secondary | ICD-10-CM

## 2017-01-14 DIAGNOSIS — E7800 Pure hypercholesterolemia, unspecified: Secondary | ICD-10-CM

## 2017-01-14 DIAGNOSIS — Z780 Asymptomatic menopausal state: Secondary | ICD-10-CM | POA: Diagnosis not present

## 2017-01-14 LAB — POCT URINALYSIS DIP (MANUAL ENTRY)
Bilirubin, UA: NEGATIVE
GLUCOSE UA: NEGATIVE mg/dL
Ketones, POC UA: NEGATIVE mg/dL
Leukocytes, UA: NEGATIVE
NITRITE UA: NEGATIVE
PH UA: 7.5 (ref 5.0–8.0)
Protein Ur, POC: NEGATIVE mg/dL
Spec Grav, UA: 1.01 (ref 1.010–1.025)
UROBILINOGEN UA: 0.2 U/dL

## 2017-01-14 LAB — GLUCOSE, POCT (MANUAL RESULT ENTRY): POC GLUCOSE: 110 mg/dL — AB (ref 70–99)

## 2017-01-14 LAB — POCT GLYCOSYLATED HEMOGLOBIN (HGB A1C): HEMOGLOBIN A1C: 6.7

## 2017-01-14 NOTE — Progress Notes (Signed)
Subjective:    Patient ID: Jodi Parker, female    DOB: [birth year], 51 y.o.   MRN: [id number]  01/14/2017  Annual Exam (with PAP)   HPI This 51 y.o. female presents for Complete Physical Examination.  Last physical:  09-09-2015 Pap smear:  09-09-15  WNL HPV negative; no menses in 3 years. Mammogram:  10-01-15 Colonoscopy: never; agreeable. Eye exam:  2017 Dental exam:  Every six months.  BP Readings from Last 3 Encounters:  01/14/17 116/78  02/03/16 110/70  09/09/15 114/76   Wt Readings from Last 3 Encounters:  01/14/17 116 lb (52.6 kg)  02/03/16 120 lb (54.4 kg)  09/09/15 123 lb 4 oz (55.9 kg)   Immunization History  Administered Date(s) Administered  . Pneumococcal Polysaccharide-23 01/14/2017  . Tdap 06/28/2010      Review of Systems  Constitutional: Negative for activity change, appetite change, chills, diaphoresis, fatigue, fever and unexpected weight change.  HENT: Negative for congestion, dental problem, drooling, ear discharge, ear pain, facial swelling, hearing loss, mouth sores, nosebleeds, postnasal drip, rhinorrhea, sinus pressure, sneezing, sore throat, tinnitus, trouble swallowing and voice change.   Eyes: Negative for photophobia, pain, discharge, redness, itching and visual disturbance.  Respiratory: Negative for apnea, cough, choking, chest tightness, shortness of breath, wheezing and stridor.   Cardiovascular: Negative for chest pain, palpitations and leg swelling.  Gastrointestinal: Negative for abdominal distention, abdominal pain, anal bleeding, blood in stool, constipation, diarrhea, nausea, rectal pain and vomiting.  Endocrine: Negative for cold intolerance, heat intolerance, polydipsia, polyphagia and polyuria.  Genitourinary: Negative for decreased urine volume, difficulty urinating, dyspareunia, dysuria, enuresis, flank pain, frequency, genital sores, hematuria, menstrual problem, pelvic pain, urgency, vaginal bleeding, vaginal discharge and vaginal  pain.  Musculoskeletal: Negative for arthralgias, back pain, gait problem, joint swelling, myalgias, neck pain and neck stiffness.  Skin: Negative for color change, pallor, rash and wound.  Allergic/Immunologic: Negative for environmental allergies, food allergies and immunocompromised state.  Neurological: Negative for dizziness, tremors, seizures, syncope, facial asymmetry, speech difficulty, weakness, light-headedness, numbness and headaches.  Hematological: Negative for adenopathy. Does not bruise/bleed easily.  Psychiatric/Behavioral: Negative for agitation, behavioral problems, confusion, decreased concentration, dysphoric mood, hallucinations, self-injury, sleep disturbance and suicidal ideas. The patient is not nervous/anxious and is not hyperactive.     Past Medical History:  Diagnosis Date  . Allergy    Claritin PRN.  . Diabetes mellitus without complication (HCC)   . Hyperlipidemia    No past surgical history on file. No Known Allergies Current Outpatient Prescriptions  Medication Sig Dispense Refill  . cholecalciferol (VITAMIN D) 1000 UNITS tablet Take 1,000 Units by mouth daily.    . fish oil-omega-3 fatty acids 1000 MG capsule Take 1 g by mouth 2 (two) times daily.     No current facility-administered medications for this visit.    Social History   Social History  . Marital status: Married    Spouse name: N/A  . Number of children: N/A  . Years of education: N/A   Occupational History  . Not on file.   Social History Main Topics  . Smoking status: Never Smoker  . Smokeless tobacco: Never Used  . Alcohol use 0.0 oz/week     Comment: wine social use  . Drug use: No  . Sexual activity: Yes    Birth control/ protection: Post-menopausal   Other Topics Concern  . Not on file   Social History Narrative   Marital status: married x 30 years; from [place]; moved to [place] 22  years ago.      Children: 4 children; 1 grandchild.       Lives: with husband, 1 daughter.        Employment: works for [employer] x 23 years.      Tobacco; none      Alcohol: wine 3 glasses per week.      Drugs: none      Exercise:  Yes; walking daily for 30 minutes.      Seatbelt: 100%; does not drive.      Guns:  None      No family history on file.     Objective:    BP 116/78   Pulse 87   Temp 98 F (36.7 C) (Oral)   Resp 18   Ht 5' 1.42" (1.56 m)   Wt 116 lb (52.6 kg)   SpO2 97%   BMI 21.62 kg/m  Physical Exam  Constitutional: She is oriented to person, place, and time. She appears well-developed and well-nourished. No distress.  HENT:  Head: Normocephalic and atraumatic.  Right Ear: External ear normal.  Left Ear: External ear normal.  Nose: Nose normal.  Mouth/Throat: Oropharynx is clear and moist.  Eyes: Pupils are equal, round, and reactive to light. Conjunctivae and EOM are normal.  Neck: Normal range of motion and full passive range of motion without pain. Neck supple. No JVD present. Carotid bruit is not present. No thyromegaly present.  Cardiovascular: Normal rate, regular rhythm and normal heart sounds.  Exam reveals no gallop and no friction rub.   No murmur heard. Pulmonary/Chest: Effort normal and breath sounds normal. She has no wheezes. She has no rales. Right breast exhibits no inverted nipple, no mass, no nipple discharge, no skin change and no tenderness. Left breast exhibits no inverted nipple, no mass, no nipple discharge, no skin change and no tenderness. Breasts are symmetrical.  Abdominal: Soft. Bowel sounds are normal. She exhibits no distension and no mass. There is no tenderness. There is no rebound and no guarding.  Genitourinary: Vagina normal. No labial fusion. There is no rash, tenderness, lesion or injury on the right labia. There is no rash, tenderness, lesion or injury on the left labia.  Musculoskeletal:       Right shoulder: Normal.       Left shoulder: Normal.       Cervical back: Normal.  Lymphadenopathy:    She  has no cervical adenopathy.  Neurological: She is alert and oriented to person, place, and time. She has normal reflexes. No cranial nerve deficit. She exhibits normal muscle tone. Coordination normal.  Skin: Skin is warm and dry. No rash noted. She is not diaphoretic. No erythema. No pallor.  Psychiatric: She has a normal mood and affect. Her behavior is normal. Judgment and thought content normal.  Nursing note and vitals reviewed.   [facility] 01/14/2017 02/03/2016 09/09/2015  Decreased Interest 0 0 0  Down, Depressed, Hopeless 0 0 0  PHQ - 2 Score 0 0 0       Assessment & Plan:   1. Routine physical examination   2. Type 2 diabetes mellitus without complication, without long-term current use of insulin (HCC)   3. Colon cancer screening   4. Pure hypercholesterolemia   5. Need for prophylactic vaccination against Streptococcus pneumoniae (pneumococcus)   6. Menopause    -anticipatory guidance provided --- exercise, weight loss, safe drivingpractices, aspirin 81mg  daily. -obtain age appropriate screening labs and labs for chronic disease management.  I recommend weight loss, exercise, and low-carbohydrate low-sugar food choices. You should AVOID: regular sodas, sweetened tea, fruit juices.  You should LIMIT: breads, pastas, rice, potatoes, and desserts/sweets.  I would recommend limiting your total carbohydrate intake per meal to 45 grams; I would limit your total carbohydrate intake per snack to 30 grams.  I would also have a goal of 60 grams of protein intake per day; this would equal 10-15 grams of protein per meal and 5-10 grams of protein per snack.    Orders Placed This Encounter  Procedures  . Pneumococcal polysaccharide vaccine 23-valent greater than or equal to 2yo subcutaneous/IM  . CBC with Differential/Platelet  . Comprehensive metabolic panel    Order Specific Question:   Has the patient fasted?    Answer:   Yes  . Lipid panel    Order Specific Question:    Has the patient fasted?    Answer:   Yes  . Microalbumin, urine  . TSH  . Ambulatory referral to Gastroenterology    Referral Priority:   Routine    Referral Type:   Consultation    Referral Reason:   Specialty Services Required    Number of Visits Requested:   1  . Ambulatory referral to diabetic education    Referral Priority:   Routine    Referral Type:   Consultation    Referral Reason:   Specialty Services Required    Number of Visits Requested:   1  . POCT urinalysis dipstick  . POCT glycosylated hemoglobin (Hb A1C)  . POCT glucose (manual entry)  . EKG 12-Lead   No orders of the defined types were placed in this encounter.   Return in about 6 months (around 07/17/2017) for recheck diabetes, high cholesterol.   Kristi Paulita Fujita, M.D. Primary Care at [facility] previously [facility] [address] [place], [place]  [address] [phone] phone [phone] fax

## 2017-01-14 NOTE — Patient Instructions (Addendum)
IF you received an x-ray today, you will receive an invoice from [facility]. Please contact [facility] at [phone] with questions or concerns regarding your invoice.   IF you received labwork today, you will receive an invoice from [facility]. Please contact [facility] at [phone] with questions or concerns regarding your invoice.   Our billing staff will not be able to assist you with questions regarding bills from these companies.  You will be contacted with the lab results as soon as they are available. The fastest way to get your results is to activate your My Chart account. Instructions are located on the last page of this paperwork. If you have not heard from [place] regarding the results in 2 weeks, please contact this office.      Preventive Care 40-64 Years, Female Preventive care refers to lifestyle choices and visits with your health care provider that can promote health and wellness. What does preventive care include?  A yearly physical exam. This is also called an annual well check.  Dental exams once or twice a year.  Routine eye exams. Ask your health care provider how often you should have your eyes checked.  Personal lifestyle choices, including: ? Daily care of your teeth and gums. ? Regular physical activity. ? Eating a healthy diet. ? Avoiding tobacco and drug use. ? Limiting alcohol use. ? Practicing safe sex. ? Taking low-dose aspirin daily starting at age 34. ? Taking vitamin and mineral supplements as recommended by your health care provider. What happens during an annual well check? The services and screenings done by your health care provider during your annual well check will depend on your age, overall health, lifestyle risk factors, and family history of disease. Counseling Your health care provider may ask you questions about your:  Alcohol use.  Tobacco use.  Drug use.  Emotional well-being.  Home and relationship  well-being.  Sexual activity.  Eating habits.  Work and work Statistician.  Method of birth control.  Menstrual cycle.  Pregnancy history.  Screening You may have the following tests or measurements:  Height, weight, and BMI.  Blood pressure.  Lipid and cholesterol levels. These may be checked every 5 years, or more frequently if you are over 65 years old.  Skin check.  Lung cancer screening. You may have this screening every year starting at age 102 if you have a 30-pack-year history of smoking and currently smoke or have quit within the past 15 years.  Fecal occult blood test (FOBT) of the stool. You may have this test every year starting at age 33.  Flexible sigmoidoscopy or colonoscopy. You may have a sigmoidoscopy every 5 years or a colonoscopy every 10 years starting at age 23.  Hepatitis C blood test.  Hepatitis B blood test.  Sexually transmitted disease (STD) testing.  Diabetes screening. This is done by checking your blood sugar (glucose) after you have not eaten for a while (fasting). You may have this done every 1-3 years.  Mammogram. This may be done every 1-2 years. Talk to your health care provider about when you should start having regular mammograms. This may depend on whether you have a family history of breast cancer.  BRCA-related cancer screening. This may be done if you have a family history of breast, ovarian, tubal, or peritoneal cancers.  Pelvic exam and Pap test. This may be done every 3 years starting at age 3. Starting at age 55, this may be done every 5 years if  you have a Pap test in combination with an HPV test.  Bone density scan. This is done to screen for osteoporosis. You may have this scan if you are at high risk for osteoporosis.  Discuss your test results, treatment options, and if necessary, the need for more tests with your health care provider. Vaccines Your health care provider may recommend certain vaccines, such  as:  Influenza vaccine. This is recommended every year.  Tetanus, diphtheria, and acellular pertussis (Tdap, Td) vaccine. You may need a Td booster every 10 years.  Varicella vaccine. You may need this if you have not been vaccinated.  Zoster vaccine. You may need this after age 70.  Measles, mumps, and rubella (MMR) vaccine. You may need at least one dose of MMR if you were born in 1957 or later. You may also need a second dose.  Pneumococcal 13-valent conjugate (PCV13) vaccine. You may need this if you have certain conditions and were not previously vaccinated.  Pneumococcal polysaccharide (PPSV23) vaccine. You may need one or two doses if you smoke cigarettes or if you have certain conditions.  Meningococcal vaccine. You may need this if you have certain conditions.  Hepatitis A vaccine. You may need this if you have certain conditions or if you travel or work in places where you may be exposed to hepatitis A.  Hepatitis B vaccine. You may need this if you have certain conditions or if you travel or work in places where you may be exposed to hepatitis B.  Haemophilus influenzae type b (Hib) vaccine. You may need this if you have certain conditions.  Talk to your health care provider about which screenings and vaccines you need and how often you need them. This information is not intended to replace advice given to you by your health care provider. Make sure you discuss any questions you have with your health care provider. Document Released: 07/11/2015 Document Revised: 03/03/2016 Document Reviewed: 04/15/2015 Elsevier Interactive Patient Education  2017 [employer].

## 2017-04-07 ENCOUNTER — Encounter: Payer: Self-pay | Admitting: Family Medicine

## 2017-06-15 ENCOUNTER — Ambulatory Visit: Payer: BLUE CROSS/BLUE SHIELD | Admitting: Family Medicine

## 2017-07-04 ENCOUNTER — Ambulatory Visit: Payer: BLUE CROSS/BLUE SHIELD | Admitting: Family Medicine

## 2017-07-04 ENCOUNTER — Other Ambulatory Visit: Payer: Self-pay

## 2017-07-04 ENCOUNTER — Encounter: Payer: Self-pay | Admitting: Family Medicine

## 2017-07-04 VITALS — BP 122/82 | HR 87 | Temp 98.0°F | Resp 16 | Ht 62.6 in | Wt 118.0 lb

## 2017-07-04 DIAGNOSIS — F4321 Adjustment disorder with depressed mood: Secondary | ICD-10-CM

## 2017-07-04 DIAGNOSIS — R404 Transient alteration of awareness: Secondary | ICD-10-CM

## 2017-07-04 DIAGNOSIS — Z23 Encounter for immunization: Secondary | ICD-10-CM

## 2017-07-04 DIAGNOSIS — F329 Major depressive disorder, single episode, unspecified: Secondary | ICD-10-CM

## 2017-07-04 DIAGNOSIS — E119 Type 2 diabetes mellitus without complications: Secondary | ICD-10-CM | POA: Diagnosis not present

## 2017-07-04 DIAGNOSIS — F5104 Psychophysiologic insomnia: Secondary | ICD-10-CM

## 2017-07-04 DIAGNOSIS — F32A Depression, unspecified: Secondary | ICD-10-CM

## 2017-07-04 DIAGNOSIS — E7800 Pure hypercholesterolemia, unspecified: Secondary | ICD-10-CM

## 2017-07-04 DIAGNOSIS — F419 Anxiety disorder, unspecified: Secondary | ICD-10-CM | POA: Diagnosis not present

## 2017-07-04 LAB — POCT URINALYSIS DIP (MANUAL ENTRY)
BILIRUBIN UA: NEGATIVE
GLUCOSE UA: NEGATIVE mg/dL
Ketones, POC UA: NEGATIVE mg/dL
NITRITE UA: NEGATIVE
Protein Ur, POC: NEGATIVE mg/dL
RBC UA: NEGATIVE
Spec Grav, UA: 1.015 (ref 1.010–1.025)
UROBILINOGEN UA: 1 U/dL
pH, UA: 7.5 (ref 5.0–8.0)

## 2017-07-04 LAB — POCT GLYCOSYLATED HEMOGLOBIN (HGB A1C): HEMOGLOBIN A1C: 6.1

## 2017-07-04 LAB — GLUCOSE, POCT (MANUAL RESULT ENTRY): POC GLUCOSE: 99 mg/dL (ref 70–99)

## 2017-07-04 MED ORDER — MIRTAZAPINE 7.5 MG PO TABS: 7.5000 mg | ORAL_TABLET | Freq: Every day | ORAL | 1 refills | Status: DC

## 2017-07-04 NOTE — Patient Instructions (Signed)
     IF you received an x-ray today, you will receive an invoice from [facility]. Please contact [facility] at [phone] with questions or concerns regarding your invoice.   IF you received labwork today, you will receive an invoice from [facility]. Please contact [facility] at [phone] with questions or concerns regarding your invoice.   Our billing staff will not be able to assist you with questions regarding bills from these companies.  You will be contacted with the lab results as soon as they are available. The fastest way to get your results is to activate your My Chart account. Instructions are located on the last page of this paperwork. If you have not heard from us regarding the results in 2 weeks, please contact this office.     

## 2017-07-04 NOTE — Progress Notes (Signed)
Subjective:    Patient ID: Jodi Parker, female    DOB: 04-Sep-1965, 52 y.o.   MRN: [id number]  07/04/2017  Diabetes (6 month follow-up ); Hyperlipidemia; and Seizures (pt had a Seizure the first week of Dec.)    HPI This 52 y.o. female presents for six month follow-up of DMII, hypercholesterolemia and also for HOSPITAL FOLLOW-UP FOR possible seizure activity.  Patient went back to [place] for mother's funeral.  Patient has been depressed and anxious; has been unemployed since August 2018.  Stress over finances.  At home and alone.  Depressed.  Anxious about funeral.  Death of mother unexpectedly.  Traveling was tough; 30 hour flights.  Not sleeping well prior to funeral.  Two to three days into trip, really late at night at 10:00pm.  Standing up and shaking all over, urinated on self, biting teeth.  Had bitten tongue. Eyes were all over place.  Not unconscious; could not hear [place]; unresponsive. Put down on the ground and started foaming.  Closed eyes and went blue. Gave CPR and came back and turned pink.  Presented to Emergency Room; better hospital in area in [place].  Have not lived in [place] for a long time.  Did not feel due to seizure; prescribed medication for depression/anxiety and epilepsy.  Sister's husband's dad is a physician. Wanted to come back earlier yet pt insisted to stay and handle issues at [place].  Returned from [place] 06/20/17.  No recurrent seizure activity since initial seizure activity. Duration of seizure activity 3-5 minutes; complete chaos.  Husband really anxious as well about event. Took medications for two weeks and then stopped.   Remembers falling; fell onto aunts's arms; felt dizzy and falling.  No head trauma.  Did not lose consciousness.  No headache; no diplopia or blurred vision.  No chest pain; no shortness of breath; no palpitations.   Seemed really normal; family had lunch with her and was really normal prior to event. Did have a headache for a few days and  thought due to lack of stress; mild headache.  No nausea or vomiting or diarrhea; no fever; whole face was blue; lightly breathing but face was blue.  No similar episodes in the past.  Since event, doing fine.  Sleeping fine now; no insomnia.  Took Melatonin after seizure; taking sporadic. Took Melatonin last night.  Still anxious due to still being unemployed; still issues in [place] with family members; anxious.   Has never driven.  BP Readings from Last 3 Encounters:  07/04/17 122/82  01/14/17 116/78  02/03/16 110/70   Wt Readings from Last 3 Encounters:  07/04/17 118 lb (53.5 kg)  01/14/17 116 lb (52.6 kg)  02/03/16 120 lb (54.4 kg)   Immunization History  Administered Date(s) Administered  . Influenza,inj,Quad PF,6+ Mos 07/04/2017  . Pneumococcal Polysaccharide-23 01/14/2017  . Tdap 06/28/2010    Review of Systems  Constitutional: Negative for activity change, appetite change, chills, diaphoresis, fatigue, fever and unexpected weight change.  HENT: Negative for congestion, dental problem, drooling, ear discharge, ear pain, facial swelling, hearing loss, mouth sores, nosebleeds, postnasal drip, rhinorrhea, sinus pressure, sneezing, sore throat, tinnitus, trouble swallowing and voice change.   Eyes: Negative for photophobia, pain, discharge, redness, itching and visual disturbance.  Respiratory: Negative for apnea, cough, choking, chest tightness, shortness of breath, wheezing and stridor.   Cardiovascular: Negative for chest pain, palpitations and leg swelling.  Gastrointestinal: Negative for abdominal distention, abdominal pain, anal bleeding, blood in stool, constipation, diarrhea, nausea, rectal  pain and vomiting.  Endocrine: Negative for cold intolerance, heat intolerance, polydipsia, polyphagia and polyuria.  Genitourinary: Negative for decreased urine volume, difficulty urinating, dyspareunia, dysuria, enuresis, flank pain, frequency, genital sores, hematuria, menstrual  problem, pelvic pain, urgency, vaginal bleeding, vaginal discharge and vaginal pain.  Musculoskeletal: Negative for arthralgias, back pain, gait problem, joint swelling, myalgias, neck pain and neck stiffness.  Skin: Negative for color change, pallor, rash and wound.  Allergic/Immunologic: Negative for environmental allergies, food allergies and immunocompromised state.  Neurological: Positive for seizures and syncope. Negative for dizziness, tremors, facial asymmetry, speech difficulty, weakness, light-headedness, numbness and headaches.  Hematological: Negative for adenopathy. Does not bruise/bleed easily.  Psychiatric/Behavioral: Positive for dysphoric mood and sleep disturbance. Negative for agitation, behavioral problems, confusion, decreased concentration, hallucinations, self-injury and suicidal ideas. The patient is nervous/anxious. The patient is not hyperactive.     Past Medical History:  Diagnosis Date  . Allergy    Claritin PRN.  . Diabetes mellitus without complication (HCC)   . Hyperlipidemia    History reviewed. No pertinent surgical history. No Known Allergies Current Outpatient Medications on File Prior to Visit  Medication Sig Dispense Refill  . cholecalciferol (VITAMIN D) 1000 UNITS tablet Take 1,000 Units by mouth daily.    . fish oil-omega-3 fatty acids 1000 MG capsule Take 1 g by mouth 2 (two) times daily.     No current facility-administered medications on file prior to visit.    Social History   Socioeconomic History  . Marital status: Married    Spouse name: Not on file  . Number of children: Not on file  . Years of education: Not on file  . Highest education level: Not on file  Social Needs  . Financial resource strain: Not on file  . Food insecurity - worry: Not on file  . Food insecurity - inability: Not on file  . Transportation needs - medical: Not on file  . Transportation needs - non-medical: Not on file  Occupational History  . Not on file    Tobacco Use  . Smoking status: Never Smoker  . Smokeless tobacco: Never Used  Substance and Sexual Activity  . Alcohol use: Yes    Alcohol/week: 0.0 oz    Comment: wine social use  . Drug use: No  . Sexual activity: Yes    Birth control/protection: Post-menopausal  Other Topics Concern  . Not on file  Social History Narrative   Marital status: married x 30 years; from [place]; moved to [place] 22 years ago.      Children: 4 children; 1 grandchild.       Lives: with husband, 1 daughter.      Employment: works for [employer] x 23 years.      Tobacco; none      Alcohol: wine 3 glasses per week.      Drugs: none      Exercise:  Yes; walking daily for 30 minutes.      Seatbelt: 100%; does not drive.      Guns:  None   History reviewed. No pertinent family history.     Objective:    BP 122/82   Pulse 87   Temp 98 F (36.7 C) (Oral)   Resp 16   Ht 5' 2.6" (1.59 m)   Wt 118 lb (53.5 kg)   SpO2 98%   BMI 21.17 kg/m  Physical Exam  Constitutional: She is oriented to person, place, and time. She appears well-developed and well-nourished. No distress.  HENT:  Head: Normocephalic and atraumatic.  Right Ear: External ear normal.  Left Ear: External ear normal.  Nose: Nose normal.  Mouth/Throat: Oropharynx is clear and moist.  Eyes: Conjunctivae and EOM are normal. Pupils are equal, round, and reactive to light.  Neck: Normal range of motion and full passive range of motion without pain. Neck supple. No JVD present. Carotid bruit is not present. No thyromegaly present.  Cardiovascular: Normal rate, regular rhythm and normal heart sounds. Exam reveals no gallop and no friction rub.  No murmur heard. Pulmonary/Chest: Effort normal and breath sounds normal. She has no wheezes. She has no rales. Right breast exhibits no inverted nipple, no mass, no nipple discharge, no skin change and no tenderness. Left breast exhibits no inverted nipple, no mass, no nipple discharge, no skin  change and no tenderness. Breasts are symmetrical.  Abdominal: Soft. Bowel sounds are normal. She exhibits no distension and no mass. There is no tenderness. There is no rebound and no guarding.  Musculoskeletal:       Right shoulder: Normal.       Left shoulder: Normal.       Cervical back: Normal.  Lymphadenopathy:    She has no cervical adenopathy.  Neurological: She is alert and oriented to person, place, and time. She has normal reflexes. No cranial nerve deficit. She exhibits normal muscle tone. Coordination normal.  Skin: Skin is warm and dry. No rash noted. She is not diaphoretic. No erythema. No pallor.  Psychiatric: She has a normal mood and affect. Her behavior is normal. Judgment and thought content normal.  Nursing note and vitals reviewed.  No results found. Depression screen [facility] 2/9 07/04/2017 01/14/2017 02/03/2016 09/09/2015  Decreased Interest 0 0 0 0  Down, Depressed, Hopeless 0 0 0 0  PHQ - 2 Score 0 0 0 0   Fall Risk  07/04/2017 01/14/2017 02/03/2016 09/09/2015  Falls in the past year? Yes No No No  Number falls in past yr: 1 - - -  Injury with Fall? No - - -        Assessment & Plan:   1. Transient alteration of awareness   2. Type 2 diabetes mellitus without complication, without long-term current use of insulin (HCC)   3. Pure hypercholesterolemia   4. Anxiety and depression   5. Need for prophylactic vaccination and inoculation against influenza   6. Psychophysiological insomnia   7. Grief reaction    New onset altered mental status while in [place] for mother's funeral.  Symptoms included shaking all over, urinary incontinence, tongue biting.  Very concerning for seizure activity.  Patient presented to emergency department in [place] and diagnosed with anxiety.  No recurrent episodes since returning to the [place].  Obtain labs, EKG, MRI of brain.  Refer to neurology for further evaluation of seizure activity.  If neurological evaluation is negative, will refer  to cardiology to rule out cardiac etiology yet seems less likely.  Patient does not drive.  New onset anxiety and depression with insomnia due to unemployment and recent death of mother.  Initiate Remeron 7.5 mg at bedtime to assist with insomnia as well as depression.  Good support from family.  Diabetes mellitus type 2 controlled with diet and weight management.  Obtain labs.  -prolonged face-to-face for 40 minutes with greater than 50% of time dedicated to counseling and coordination of care.   Orders Placed This Encounter  Procedures  . MR Brain Wo Contrast    Epic ORDER WT 110/HT 5'1/NO  CLAUS/NO NEEDS/NO METAL IN EYES OR BODY/NO HX OF SX TO BRAIN,HEART,EYES OR EARS/NO IMPLANTS/INS-BCBS SB W/PTS DAUGHTER     Standing Status:   Future    Standing Expiration Date:   09/02/2018    Order Specific Question:   What is the patient's sedation requirement?    Answer:   No Sedation    Order Specific Question:   Does the patient have a pacemaker or implanted devices?    Answer:   No    Order Specific Question:   Preferred imaging location?    Answer:   [facility] (table limit-550lbs)    Order Specific Question:   Radiology Contrast Protocol - do NOT remove file path    Answer:   [email or web address]  . Flu Vaccine QUAD 36+ mos IM  . CBC with Differential/Platelet  . Comprehensive metabolic panel    Order Specific Question:   Has the patient fasted?    Answer:   No  . Lipid panel    Order Specific Question:   Has the patient fasted?    Answer:   No  . Microalbumin, urine  . TSH  . TSH  . POCT urinalysis dipstick  . POCT glucose (manual entry)  . POCT glycosylated hemoglobin (Hb A1C)  . EKG 12-Lead   Meds ordered this encounter  Medications  . mirtazapine (REMERON) 7.5 MG tablet    Sig: Take 1 tablet (7.5 mg total) by mouth at bedtime.    Dispense:  90 tablet    Refill:  1    Return in about 6 weeks (around 08/15/2017) for follow-up chronic  medical conditions.   Rini Moffit Paulita FujitaMartin Ama Mcmaster, M.D. Primary Care at [facility]  [place] [facility] [address] [place], [place]  [id number] [phone] phone [phone] fax

## 2017-07-05 LAB — MICROALBUMIN, URINE: MICROALBUM., U, RANDOM: 16.9 ug/mL

## 2017-07-05 LAB — LIPID PANEL
Chol/HDL Ratio: 4.6 ratio — ABNORMAL HIGH (ref 0.0–4.4)
Cholesterol, Total: 228 mg/dL — ABNORMAL HIGH (ref 100–199)
HDL: 50 mg/dL (ref >39)
LDL Calculated: 146 mg/dL — ABNORMAL HIGH (ref 0–99)
TRIGLYCERIDES: 160 mg/dL — AB (ref 0–149)
VLDL Cholesterol Cal: 32 mg/dL (ref 5–40)

## 2017-07-05 LAB — CBC WITH DIFFERENTIAL/PLATELET
BASOS: 1 %
Basophils Absolute: 0.1 10*3/uL (ref 0.0–0.2)
EOS (ABSOLUTE): 0 10*3/uL (ref 0.0–0.4)
Eos: 1 %
Hematocrit: 38 % (ref 34.0–46.6)
Hemoglobin: 12.5 g/dL (ref 11.1–15.9)
IMMATURE GRANULOCYTES: 0 %
Immature Grans (Abs): 0 10*3/uL (ref 0.0–0.1)
LYMPHS ABS: 2.5 10*3/uL (ref 0.7–3.1)
Lymphs: 46 %
MCH: 21.3 pg — ABNORMAL LOW (ref 26.6–33.0)
MCHC: 32.9 g/dL (ref 31.5–35.7)
MCV: 65 fL — AB (ref 79–97)
MONOS ABS: 0.3 10*3/uL (ref 0.1–0.9)
Monocytes: 6 %
NEUTROS PCT: 46 %
Neutrophils Absolute: 2.5 10*3/uL (ref 1.4–7.0)
PLATELETS: 316 10*3/uL (ref 150–379)
RBC: 5.86 x10E6/uL — AB (ref 3.77–5.28)
RDW: 15.1 % (ref 12.3–15.4)
WBC: 5.4 10*3/uL (ref 3.4–10.8)

## 2017-07-05 LAB — COMPREHENSIVE METABOLIC PANEL
A/G RATIO: 1.3 (ref 1.2–2.2)
ALT: 21 IU/L (ref 0–32)
AST: 21 IU/L (ref 0–40)
Albumin: 4.6 g/dL (ref 3.5–5.5)
Alkaline Phosphatase: 65 IU/L (ref 39–117)
BUN/Creatinine Ratio: 22 (ref 9–23)
BUN: 13 mg/dL (ref 6–24)
Bilirubin Total: 0.5 mg/dL (ref 0.0–1.2)
CALCIUM: 9.7 mg/dL (ref 8.7–10.2)
CO2: 22 mmol/L (ref 20–29)
Chloride: 103 mmol/L (ref 96–106)
Creatinine, Ser: 0.6 mg/dL (ref 0.57–1.00)
GFR calc Af Amer: 121 mL/min/{1.73_m2} (ref >59)
GFR, EST NON AFRICAN AMERICAN: 105 mL/min/{1.73_m2} (ref >59)
GLOBULIN, TOTAL: 3.6 g/dL (ref 1.5–4.5)
Glucose: 107 mg/dL — ABNORMAL HIGH (ref 65–99)
POTASSIUM: 4.1 mmol/L (ref 3.5–5.2)
SODIUM: 140 mmol/L (ref 134–144)
Total Protein: 8.2 g/dL (ref 6.0–8.5)

## 2017-07-05 LAB — TSH: TSH: 2.32 u[IU]/mL (ref 0.450–4.500)

## 2017-07-11 ENCOUNTER — Encounter: Payer: Self-pay | Admitting: Family Medicine

## 2017-07-13 ENCOUNTER — Telehealth: Payer: Self-pay | Admitting: Family Medicine

## 2017-07-13 ENCOUNTER — Encounter: Payer: Self-pay | Admitting: Neurology

## 2017-07-13 NOTE — Telephone Encounter (Signed)
Called pt daughter Lesly RubensteinJade and left a message to let her know that [facility] been trying to contact pt to sch for MR BRAIN WO CONTRAST. Left Jade [facility] phone number and told her to call back if she has any questions.

## 2017-07-20 ENCOUNTER — Ambulatory Visit: Payer: BLUE CROSS/BLUE SHIELD | Admitting: Family Medicine

## 2017-07-25 ENCOUNTER — Ambulatory Visit
Admission: RE | Admit: 2017-07-25 | Discharge: 2017-07-25 | Disposition: A | Discharge status: Home / Self Care | Payer: BLUE CROSS/BLUE SHIELD | Source: Ambulatory Visit | Attending: Family Medicine | Admitting: Family Medicine

## 2017-07-25 ENCOUNTER — Other Ambulatory Visit: Payer: BLUE CROSS/BLUE SHIELD

## 2017-07-25 DIAGNOSIS — R404 Transient alteration of awareness: Secondary | ICD-10-CM

## 2017-07-27 ENCOUNTER — Encounter: Payer: Self-pay | Admitting: Family Medicine

## 2017-08-17 ENCOUNTER — Ambulatory Visit: Payer: BLUE CROSS/BLUE SHIELD | Admitting: Family Medicine

## 2017-09-23 ENCOUNTER — Ambulatory Visit: Payer: BLUE CROSS/BLUE SHIELD | Admitting: Neurology

## 2017-11-21 ENCOUNTER — Encounter: Payer: Self-pay | Admitting: Family Medicine

## 2017-12-02 ENCOUNTER — Encounter

## 2017-12-02 ENCOUNTER — Ambulatory Visit: Payer: BLUE CROSS/BLUE SHIELD | Admitting: Neurology

## 2017-12-20 ENCOUNTER — Other Ambulatory Visit: Payer: Self-pay | Admitting: Family Medicine

## 2017-12-21 NOTE — Telephone Encounter (Signed)
Refill request for mirtazapine 7.5 mg # 30 with no refills approved.  Pt last seen on 07/04/2017 and has upcoming appt with mcvey on 01/20/18. Dgaddy, CMA

## 2018-01-17 ENCOUNTER — Other Ambulatory Visit: Payer: Self-pay | Admitting: Family Medicine

## 2018-01-20 ENCOUNTER — Ambulatory Visit (INDEPENDENT_AMBULATORY_CARE_PROVIDER_SITE_OTHER): Payer: BLUE CROSS/BLUE SHIELD | Admitting: Physician Assistant

## 2018-01-20 ENCOUNTER — Encounter: Payer: Self-pay | Admitting: Physician Assistant

## 2018-01-20 VITALS — BP 106/72 | HR 76 | Temp 98.4°F | Resp 16 | Ht 61.0 in | Wt 121.8 lb

## 2018-01-20 DIAGNOSIS — Z0000 Encounter for general adult medical examination without abnormal findings: Secondary | ICD-10-CM

## 2018-01-20 DIAGNOSIS — Z1329 Encounter for screening for other suspected endocrine disorder: Secondary | ICD-10-CM

## 2018-01-20 DIAGNOSIS — M545 Low back pain, unspecified: Secondary | ICD-10-CM

## 2018-01-20 DIAGNOSIS — F32A Depression, unspecified: Secondary | ICD-10-CM

## 2018-01-20 DIAGNOSIS — F329 Major depressive disorder, single episode, unspecified: Secondary | ICD-10-CM

## 2018-01-20 DIAGNOSIS — Z13228 Encounter for screening for other metabolic disorders: Secondary | ICD-10-CM | POA: Diagnosis not present

## 2018-01-20 DIAGNOSIS — Z1211 Encounter for screening for malignant neoplasm of colon: Secondary | ICD-10-CM | POA: Diagnosis not present

## 2018-01-20 DIAGNOSIS — Z130 Encounter for screening for diseases of the blood and blood-forming organs and certain disorders involving the immune mechanism: Secondary | ICD-10-CM

## 2018-01-20 LAB — POCT URINALYSIS DIP (MANUAL ENTRY)
Bilirubin, UA: NEGATIVE
Blood, UA: NEGATIVE
Glucose, UA: NEGATIVE mg/dL
Ketones, POC UA: NEGATIVE mg/dL
Nitrite, UA: NEGATIVE
Protein Ur, POC: NEGATIVE mg/dL
Spec Grav, UA: <=1.005 — AB (ref 1.010–1.025)
Urobilinogen, UA: 0.2 E.U./dL
pH, UA: 6 (ref 5.0–8.0)

## 2018-01-20 MED ORDER — MIRTAZAPINE 7.5 MG PO TABS: 7.5000 mg | ORAL_TABLET | Freq: Every day | ORAL | 1 refills | Status: DC

## 2018-01-20 MED ORDER — CYCLOBENZAPRINE HCL 10 MG PO TABS: 10.0000 mg | ORAL_TABLET | Freq: Three times a day (TID) | ORAL | 0 refills | Status: AC | PRN

## 2018-01-20 NOTE — Progress Notes (Signed)
Primary Care at [place], [place] [address] [phone]  Date:  01/20/2018   Name:  Jodi Parker   DOB:  [birth year]   MRN:  [id number]  PCP:  Wardell Honour, MD    Chief Complaint: Annual Exam (last pap in Epic 09/09/2015 Normal results)   History of Present Illness:  This is a 52 y.o. female who  has a past medical history of Allergy, Depression, Diabetes mellitus without complication ([place]), and Hyperlipidemia. who is presenting for CPE. Last CPE 01/14/2017. She is here today with her daughter who is interpreting. She speaks vietnamese.   DM - controlled with diet and weight management. She was referred to DM education at last CPE - she did not go to this appointment. A1C 6 months ago was 6.1.   HLD - fish oil x several years.    Complaints: low back pain. Worse with bending forward. Denies urinary symptoms. Pain does not radiate.   Last pap: 09/09/2015. HPV negative. Never had abnormal PAP.  Immunizations: utd  Dentist: Every six months. Eye: 20/25 Diet/Exercise: walks daily x 45 min.  She has cut down on carbs, including rice, increased veggies.  Fam hx: family history is not on file.  Tobacco/alcohol/substance use: none.   Mammogram: 09/2015 Colonoscopy: never.   Review of Systems:  Review of Systems  Constitutional: Negative for chills, diaphoresis, fatigue and fever.  HENT: Negative for congestion, postnasal drip, rhinorrhea, sinus pressure, sneezing and sore throat.   Respiratory: Negative for cough, chest tightness, shortness of breath and wheezing.   Cardiovascular: Negative for chest pain and palpitations.  Gastrointestinal: Negative for abdominal pain, diarrhea, nausea and vomiting.  Endocrine: Negative for cold intolerance, heat intolerance, polydipsia, polyphagia and polyuria.  Genitourinary: Negative for decreased urine volume, difficulty urinating, dysuria, enuresis, flank pain, frequency, hematuria and urgency.  Musculoskeletal: Negative for  back pain.  Neurological: Negative for dizziness, weakness, light-headedness and headaches.    Patient Active Problem List   Diagnosis Date Noted  . Pure hypercholesterolemia 01/14/2017  . Type 2 diabetes mellitus without complication, without long-term current use of insulin ([place]) 01/14/2017  . Menopause 01/14/2017    Prior to Admission medications   Medication Sig Start Date End Date Taking? Authorizing Provider  cholecalciferol (VITAMIN D) 1000 UNITS tablet Take 1,000 Units by mouth daily.   Yes [provider]  fish oil-omega-3 fatty acids 1000 MG capsule Take 1 g by mouth 2 (two) times daily.   Yes [provider]  mirtazapine (REMERON) 7.5 MG tablet TAKE 1 TABLET (7.5 MG TOTAL) BY MOUTH AT BEDTIME. 01/17/18  Yes Wardell Honour, MD    No Known Allergies  No past surgical history on file.  Social History   Tobacco Use  . Smoking status: Never Smoker  . Smokeless tobacco: Never Used  Substance Use Topics  . Alcohol use: Yes    Alcohol/week: 1.8 oz    Types: 3 Standard drinks or equivalent per week    Comment: wine social use  . Drug use: No    No family history on file.  Medication list has been reviewed and updated.  Physical Examination:  Physical Exam  Constitutional: She is oriented to person, place, and time. She appears well-developed and well-nourished. No distress.  HENT:  Head: Normocephalic and atraumatic.  Mouth/Throat: Oropharynx is clear and moist.  Eyes: Pupils are equal, round, and reactive to light. Conjunctivae and EOM are normal.  Neck: Normal range of motion. No thyromegaly present.  Cardiovascular: Normal rate, regular rhythm and normal heart sounds.  No murmur heard. Pulmonary/Chest: Effort normal and breath sounds normal. She has no wheezes.  Abdominal: Soft. There is no tenderness.  Musculoskeletal: Normal range of motion.       Lumbar back: She exhibits tenderness. She exhibits normal range of motion, no bony  tenderness, no deformity, no pain and no spasm.       Back:  Neurological: She is alert and oriented to person, place, and time. She has normal reflexes.  Skin: Skin is warm and dry.  Psychiatric: She has a normal mood and affect. Her behavior is normal. Judgment and thought content normal.  Vitals reviewed.   BP 106/72 (BP Location: Right Arm, Patient Position: Sitting, Cuff Size: Normal)   Pulse 76   Temp 98.4 F (36.9 C) (Oral)   Resp 16   Ht 5' 1"  (1.549 m)   Wt 121 lb 12.8 oz (55.2 kg)   SpO2 97%   BMI 23.01 kg/m   Lab Results  Component Value Date   CHOL 228 (H) 07/04/2017   CHOL 265 (H) 09/09/2015   CHOL 237 (H) 06/27/2014   Lab Results  Component Value Date   HDL 50 07/04/2017   HDL 56 09/09/2015   HDL 46 06/27/2014   Lab Results  Component Value Date   LDLCALC 146 (H) 07/04/2017   LDLCALC 167 (H) 09/09/2015   LDLCALC 147 (H) 06/27/2014   Lab Results  Component Value Date   TRIG 160 (H) 07/04/2017   TRIG 211 (H) 09/09/2015   TRIG 218 (H) 06/27/2014   Lab Results  Component Value Date   CHOLHDL 4.6 (H) 07/04/2017   CHOLHDL 4.7 09/09/2015   CHOLHDL 5.2 06/27/2014   No results found for: LDLDIRECT  Assessment and Plan: 1. Annual physical exam - Pt presents for annual exam. She is feeling well today. DM controlled with diet and exercise. She is interested in tapering off Remeron - schedule printed out and discussed. Endorses low back pain, suspect MSK, flexeril Rx. Urine culture pending, UA +leukocytes. PAP UTD. Referral for colonoscopy. Encouraged MM. Routine labs are pending.   2. Screening for endocrine, metabolic and immunity disorder - POCT urinalysis dipstick - CBC with Differential/Platelet - Lipid panel - CMP14+EGFR - VITAMIN D 25 Hydroxy (Vit-D Deficiency, Fractures) - Hemoglobin A1c  3. Screen for colon cancer - Ambulatory referral to Gastroenterology  4. Right-sided low back pain without sciatica, unspecified chronicity -  cyclobenzaprine (FLEXERIL) 10 MG tablet; Take 1 tablet (10 mg total) by mouth 3 (three) times daily as needed for muscle spasms.  Dispense: 30 tablet; Refill: 0 - Urine Culture  Mercer Pod, PA-C  Primary Care at [place] 01/20/2018 4:06 PM

## 2018-01-20 NOTE — Patient Instructions (Addendum)
If you would like to stop taking Remeron you must decrease it slowly to prevent rebound effects: start taking 1/2 tablet at nighttime for 3-4 weeks. Then take 1/2 tablet at night time every other night for 3-4 weeks. Then you may stop taking the medication.   You will receive a phone call to schedule a colonoscopy. This is a routine colon cancer screening.   We recommend that you schedule a mammogram for breast cancer screening. Typically, you do not need a referral to do this. Please contact a local imaging center to schedule your mammogram: [facility] ([place]) - [phone] or [phone]  [place] [phone] [facility] - [phone]  Flexeril is a muscle relaxer - this may make you drowsy. Take this for your back pain. Do not take this with other medications that make you drowsy.   Health Maintenance, Female Adopting a healthy lifestyle and getting preventive care can go a long way to promote health and wellness. Talk with your health care provider about what schedule of regular examinations is right for you. This is a good chance for you to check in with your provider about disease prevention and staying healthy. In between checkups, there are plenty of things you can do on your own. Experts have done a lot of research about which lifestyle changes and preventive measures are most likely to keep you healthy. Ask your health care provider for more information. Weight and diet Eat a healthy diet  Be sure to include plenty of vegetables, fruits, low-fat dairy products, and lean protein.  Do not eat a lot of foods high in solid fats, added sugars, or salt.  Get regular exercise. This is one of the most important things you can do for your health. ? Most adults should exercise for at least 150 minutes each week. The exercise should increase your heart rate and make you sweat (moderate-intensity exercise). ? Most adults should also do  strengthening exercises at least twice a week. This is in addition to the moderate-intensity exercise.  Maintain a healthy weight  Body mass index (BMI) is a measurement that can be used to identify possible weight problems. It estimates body fat based on height and weight. Your health care provider can help determine your BMI and help you achieve or maintain a healthy weight.  For females 8 years of age and older: ? A BMI below 18.5 is considered underweight. ? A BMI of 18.5 to 24.9 is normal. ? A BMI of 25 to 29.9 is considered overweight. ? A BMI of 30 and above is considered obese.  Watch levels of cholesterol and blood lipids  You should start having your blood tested for lipids and cholesterol at 53 years of age, then have this test every 5 years.  You may need to have your cholesterol levels checked more often if: ? Your lipid or cholesterol levels are high. ? You are older than 53 years of age. ? You are at high risk for heart disease.  Cancer screening Lung Cancer  Lung cancer screening is recommended for adults 48-67 years old who are at high risk for lung cancer because of a history of smoking.  A yearly low-dose CT scan of the lungs is recommended for people who: ? Currently smoke. ? Have quit within the past 15 years. ? Have at least a 30-pack-year history of smoking. A pack year is smoking an average of one pack of cigarettes a day for 1  year.  Yearly screening should continue until it has been 15 years since you quit.  Yearly screening should stop if you develop a health problem that would prevent you from having lung cancer treatment.  Breast Cancer  Practice breast self-awareness. This means understanding how your breasts normally appear and feel.  It also means doing regular breast self-exams. Let your health care provider know about any changes, no matter how small.  If you are in your 20s or 30s, you should have a clinical breast exam (CBE) by a health  care provider every 1-3 years as part of a regular health exam.  If you are 64 or older, have a CBE every year. Also consider having a breast X-ray (mammogram) every year.  If you have a family history of breast cancer, talk to your health care provider about genetic screening.  If you are at high risk for breast cancer, talk to your health care provider about having an MRI and a mammogram every year.  Breast cancer gene (BRCA) assessment is recommended for women who have family members with BRCA-related cancers. BRCA-related cancers include: ? Breast. ? Ovarian. ? Tubal. ? Peritoneal cancers.  Results of the assessment will determine the need for genetic counseling and BRCA1 and BRCA2 testing.  Cervical Cancer Your health care provider may recommend that you be screened regularly for cancer of the pelvic organs (ovaries, uterus, and vagina). This screening involves a pelvic examination, including checking for microscopic changes to the surface of your cervix (Pap test). You may be encouraged to have this screening done every 3 years, beginning at age 75.  For women ages 72-65, health care providers may recommend pelvic exams and Pap testing every 3 years, or they may recommend the Pap and pelvic exam, combined with testing for human papilloma virus (HPV), every 5 years. Some types of HPV increase your risk of cervical cancer. Testing for HPV may also be done on women of any age with unclear Pap test results.  Other health care providers may not recommend any screening for nonpregnant women who are considered low risk for pelvic cancer and who do not have symptoms. Ask your health care provider if a screening pelvic exam is right for you.  If you have had past treatment for cervical cancer or a condition that could lead to cancer, you need Pap tests and screening for cancer for at least 20 years after your treatment. If Pap tests have been discontinued, your risk factors (such as having a new  sexual partner) need to be reassessed to determine if screening should resume. Some women have medical problems that increase the chance of getting cervical cancer. In these cases, your health care provider may recommend more frequent screening and Pap tests.  Colorectal Cancer  This type of cancer can be detected and often prevented.  Routine colorectal cancer screening usually begins at 53 years of age and continues through 53 years of age.  Your health care provider may recommend screening at an earlier age if you have risk factors for colon cancer.  Your health care provider may also recommend using home test kits to check for hidden blood in the stool.  A small camera at the end of a tube can be used to examine your colon directly (sigmoidoscopy or colonoscopy). This is done to check for the earliest forms of colorectal cancer.  Routine screening usually begins at age 70.  Direct examination of the colon should be repeated every 5-10 years through 53 years  of age. However, you may need to be screened more often if early forms of precancerous polyps or small growths are found.  Skin Cancer  Check your skin from head to toe regularly.  Tell your health care provider about any new moles or changes in moles, especially if there is a change in a mole's shape or color.  Also tell your health care provider if you have a mole that is larger than the size of a pencil eraser.  Always use sunscreen. Apply sunscreen liberally and repeatedly throughout the day.  Protect yourself by wearing long sleeves, pants, a wide-brimmed hat, and sunglasses whenever you are outside.  Heart disease, diabetes, and high blood pressure  High blood pressure causes heart disease and increases the risk of stroke. High blood pressure is more likely to develop in: ? People who have blood pressure in the high end of the normal range (130-139/85-89 mm Hg). ? People who are overweight or obese. ? People who are  African American.  If you are 31-13 years of age, have your blood pressure checked every 3-5 years. If you are 44 years of age or older, have your blood pressure checked every year. You should have your blood pressure measured twice-once when you are at a hospital or clinic, and once when you are not at a hospital or clinic. Record the average of the two measurements. To check your blood pressure when you are not at a hospital or clinic, you can use: ? An automated blood pressure machine at a pharmacy. ? A home blood pressure monitor.  If you are between 90 years and 71 years old, ask your health care provider if you should take aspirin to prevent strokes.  Have regular diabetes screenings. This involves taking a blood sample to check your fasting blood sugar level. ? If you are at a normal weight and have a low risk for diabetes, have this test once every three years after 53 years of age. ? If you are overweight and have a high risk for diabetes, consider being tested at a younger age or more often. Preventing infection Hepatitis B  If you have a higher risk for hepatitis B, you should be screened for this virus. You are considered at high risk for hepatitis B if: ? You were born in a country where hepatitis B is common. Ask your health care provider which countries are considered high risk. ? Your parents were born in a high-risk country, and you have not been immunized against hepatitis B (hepatitis B vaccine). ? You have HIV or AIDS. ? You use needles to inject street drugs. ? You live with someone who has hepatitis B. ? You have had sex with someone who has hepatitis B. ? You get hemodialysis treatment. ? You take certain medicines for conditions, including cancer, organ transplantation, and autoimmune conditions.  Hepatitis C  Blood testing is recommended for: ? Everyone born from [birth year] through 1966. ? Anyone with known risk factors for hepatitis C.  Sexually transmitted  infections (STIs)  You should be screened for sexually transmitted infections (STIs) including gonorrhea and chlamydia if: ? You are sexually active and are younger than 53 years of age. ? You are older than 53 years of age and your health care provider tells you that you are at risk for this type of infection. ? Your sexual activity has changed since you were last screened and you are at an increased risk for chlamydia or gonorrhea. Ask your health care  provider if you are at risk.  If you do not have HIV, but are at risk, it may be recommended that you take a prescription medicine daily to prevent HIV infection. This is called pre-exposure prophylaxis (PrEP). You are considered at risk if: ? You are sexually active and do not regularly use condoms or know the HIV status of your partner(s). ? You take drugs by injection. ? You are sexually active with a partner who has HIV.  Talk with your health care provider about whether you are at high risk of being infected with HIV. If you choose to begin PrEP, you should first be tested for HIV. You should then be tested every 3 months for as long as you are taking PrEP. Pregnancy  If you are premenopausal and you may become pregnant, ask your health care provider about preconception counseling.  If you may become pregnant, take 400 to 800 micrograms (mcg) of folic acid every day.  If you want to prevent pregnancy, talk to your health care provider about birth control (contraception). Osteoporosis and menopause  Osteoporosis is a disease in which the bones lose minerals and strength with aging. This can result in serious bone fractures. Your risk for osteoporosis can be identified using a bone density scan.  If you are 25 years of age or older, or if you are at risk for osteoporosis and fractures, ask your health care provider if you should be screened.  Ask your health care provider whether you should take a calcium or vitamin D supplement to lower  your risk for osteoporosis.  Menopause may have certain physical symptoms and risks.  Hormone replacement therapy may reduce some of these symptoms and risks. Talk to your health care provider about whether hormone replacement therapy is right for you. Follow these instructions at home:  Schedule regular health, dental, and eye exams.  Stay current with your immunizations.  Do not use any tobacco products including cigarettes, chewing tobacco, or electronic cigarettes.  If you are pregnant, do not drink alcohol.  If you are breastfeeding, limit how much and how often you drink alcohol.  Limit alcohol intake to no more than 1 drink per day for nonpregnant women. One drink equals 12 ounces of beer, 5 ounces of wine, or 1 ounces of hard liquor.  Do not use street drugs.  Do not share needles.  Ask your health care provider for help if you need support or information about quitting drugs.  Tell your health care provider if you often feel depressed.  Tell your health care provider if you have ever been abused or do not feel safe at home. This information is not intended to replace advice given to you by your health care provider. Make sure you discuss any questions you have with your health care provider. Document Released: 12/28/2010 Document Revised: 11/20/2015 Document Reviewed: 03/18/2015 Elsevier Interactive Patient Education  2018 [employer].  IF you received an x-ray today, you will receive an invoice from [facility]. Please contact [facility] at [phone] with questions or concerns regarding your invoice.   IF you received labwork today, you will receive an invoice from [facility]. Please contact [facility] at [phone] with questions or concerns regarding your invoice.   Our billing staff will not be able to assist you with questions regarding bills from these companies.  You will be contacted with the lab results as soon as they are available. The  fastest way to get your results is to activate your My  Chart account. Instructions are located on the last page of this paperwork. If you have not heard from [place] regarding the results in 2 weeks, please contact this office.

## 2018-01-21 LAB — CBC WITH DIFFERENTIAL/PLATELET
Basophils Absolute: 0.1 10*3/uL (ref 0.0–0.2)
Basos: 1 %
EOS (ABSOLUTE): 0.1 10*3/uL (ref 0.0–0.4)
Eos: 2 %
Hematocrit: 42.3 % (ref 34.0–46.6)
Hemoglobin: 12.7 g/dL (ref 11.1–15.9)
Immature Grans (Abs): 0 10*3/uL (ref 0.0–0.1)
Immature Granulocytes: 0 %
Lymphocytes Absolute: 2.9 10*3/uL (ref 0.7–3.1)
Lymphs: 47 %
MCH: 20.6 pg — ABNORMAL LOW (ref 26.6–33.0)
MCHC: 30 g/dL — ABNORMAL LOW (ref 31.5–35.7)
MCV: 69 fL — ABNORMAL LOW (ref 79–97)
Monocytes Absolute: 0.4 10*3/uL (ref 0.1–0.9)
Monocytes: 7 %
Neutrophils Absolute: 2.7 10*3/uL (ref 1.4–7.0)
Neutrophils: 43 %
Platelets: 330 10*3/uL (ref 150–450)
RBC: 6.16 x10E6/uL — ABNORMAL HIGH (ref 3.77–5.28)
RDW: 14.8 % (ref 12.3–15.4)
WBC: 6.2 10*3/uL (ref 3.4–10.8)

## 2018-01-21 LAB — URINE CULTURE

## 2018-01-21 LAB — CMP14+EGFR
ALT: 20 IU/L (ref 0–32)
AST: 20 IU/L (ref 0–40)
Albumin/Globulin Ratio: 1.5 (ref 1.2–2.2)
Albumin: 4.8 g/dL (ref 3.5–5.5)
Alkaline Phosphatase: 60 IU/L (ref 39–117)
BUN/Creatinine Ratio: 19 (ref 9–23)
BUN: 12 mg/dL (ref 6–24)
Bilirubin Total: 0.4 mg/dL (ref 0.0–1.2)
CO2: 20 mmol/L (ref 20–29)
Calcium: 9.9 mg/dL (ref 8.7–10.2)
Chloride: 102 mmol/L (ref 96–106)
Creatinine, Ser: 0.63 mg/dL (ref 0.57–1.00)
GFR calc Af Amer: 119 mL/min/{1.73_m2} (ref >59)
GFR calc non Af Amer: 103 mL/min/{1.73_m2} (ref >59)
Globulin, Total: 3.3 g/dL (ref 1.5–4.5)
Glucose: 97 mg/dL (ref 65–99)
Potassium: 3.8 mmol/L (ref 3.5–5.2)
Sodium: 142 mmol/L (ref 134–144)
Total Protein: 8.1 g/dL (ref 6.0–8.5)

## 2018-01-21 LAB — HEMOGLOBIN A1C
Est. average glucose Bld gHb Est-mCnc: 134 mg/dL
Hgb A1c MFr Bld: 6.3 % — ABNORMAL HIGH (ref 4.8–5.6)

## 2018-01-21 LAB — LIPID PANEL
Chol/HDL Ratio: 4.3 ratio (ref 0.0–4.4)
Cholesterol, Total: 211 mg/dL — ABNORMAL HIGH (ref 100–199)
HDL: 49 mg/dL (ref >39)
LDL Calculated: 137 mg/dL — ABNORMAL HIGH (ref 0–99)
Triglycerides: 126 mg/dL (ref 0–149)
VLDL Cholesterol Cal: 25 mg/dL (ref 5–40)

## 2018-01-21 LAB — VITAMIN D 25 HYDROXY (VIT D DEFICIENCY, FRACTURES): Vit D, 25-Hydroxy: 40 ng/mL (ref 30.0–100.0)

## 2018-01-26 ENCOUNTER — Encounter: Payer: Self-pay | Admitting: Physician Assistant

## 2018-01-26 NOTE — Progress Notes (Signed)
Cholesterol improving, A1C a little elevated. Result letter with lifestyle improvements sent to pt in mail. RTC in 1 year for repeat testing.

## 2018-02-16 ENCOUNTER — Other Ambulatory Visit: Payer: Self-pay | Admitting: Family Medicine

## 2018-02-16 MED ORDER — MIRTAZAPINE 7.5 MG PO TABS: 7.5000 mg | ORAL_TABLET | Freq: Every day | ORAL | 1 refills | Status: DC

## 2018-02-16 NOTE — Addendum Note (Signed)
Addended by: Redmond BasemanKELLER, Mekaylah Klich K on: 02/16/2018 09:33 AM   Modules accepted: Orders

## 2018-02-16 NOTE — Telephone Encounter (Signed)
Contacted pharmacy to verify that prescription request had been received; spoke with Montey HoraJacqueline Schroder, Pharmacist at [facility]# [phone] at [phone]; she says that the refill request had not been received; will resend prescription request; she also says that sometimes it takes a little while for prescriptions to be received

## 2018-02-16 NOTE — Telephone Encounter (Signed)
Verified with RX, refill was received and being processed presently.

## 2018-02-17 ENCOUNTER — Ambulatory Visit: Payer: BLUE CROSS/BLUE SHIELD | Admitting: Neurology

## 2018-04-04 ENCOUNTER — Encounter: Payer: Self-pay | Admitting: Family Medicine

## 2018-09-11 ENCOUNTER — Other Ambulatory Visit: Payer: Self-pay | Admitting: Physician Assistant

## 2018-09-11 NOTE — Telephone Encounter (Signed)
Patient called via [facility] Interpreter ID [phone], left VM to return call to the office to schedule an office visit with one of the providers, called in reference to refill request. Patient will need an establish care visit in order to receive refills.

## 2018-09-11 NOTE — Telephone Encounter (Signed)
Requested medication (s) are due for refill today: Yes  Requested medication (s) are on the active medication list: Yes  Last refill:  02/20/18  Future visit scheduled: No  Notes to clinic:  Unable to refill, appointment needed.     Requested Prescriptions  Pending Prescriptions Disp Refills   mirtazapine (REMERON) 7.5 MG tablet [Pharmacy Med Name: MIRTAZAPINE 7.5 MG TABLET] 90 tablet 1    Sig: TAKE 1 TABLET BY MOUTH AT BEDTIME     Psychiatry: Antidepressants - mirtazapine Failed - 09/11/2018  3:09 PM      Failed - Total Cholesterol in normal range and within 360 days    Cholesterol, Total  Date Value Ref Range Status  01/20/2018 211 (H) 100 - 199 mg/dL Final         Failed - Valid encounter within last 6 months    Recent Outpatient Visits          7 months ago Annual physical exam   Primary Care at [facility], Madelaine Bhat, PA-C   1 year ago Transient alteration of awareness   Primary Care at [facility], Myrle Sheng, MD   1 year ago Routine physical examination   Primary Care at [facility], Myrle Sheng, MD   2 years ago Type 2 diabetes mellitus without complication, without long-term current use of insulin [facility])   Primary Care at [facility], [place], [place]   3 years ago Routine physical examination   Primary Care at [facility], Myrle Sheng, MD             Passed - AST in normal range and within 360 days    AST  Date Value Ref Range Status  01/20/2018 20 0 - 40 IU/L Final         Passed - ALT in normal range and within 360 days    ALT  Date Value Ref Range Status  01/20/2018 20 0 - 32 IU/L Final         Passed - Triglycerides in normal range and within 360 days    Triglycerides  Date Value Ref Range Status  01/20/2018 126 0 - 149 mg/dL Final         Passed - WBC in normal range and within 360 days    WBC  Date Value Ref Range Status  01/20/2018 6.2 3.4 - 10.8 x10E3/uL Final  09/09/2015 7.0 4.0 - 10.5 K/uL Final

## 2018-10-04 ENCOUNTER — Other Ambulatory Visit: Payer: Self-pay | Admitting: Physician Assistant

## 2018-10-04 NOTE — Telephone Encounter (Signed)
Requested medication (s) are due for refill today: Yes  Requested medication (s) are on the active medication list: Yes  Last refill:  02/16/18  Future visit scheduled: No  Notes to clinic:  See request    Requested Prescriptions  Pending Prescriptions Disp Refills   mirtazapine (REMERON) 7.5 MG tablet [Pharmacy Med Name: MIRTAZAPINE 7.5 MG TABLET] 90 tablet 1    Sig: TAKE 1 TABLET BY MOUTH AT BEDTIME     Psychiatry: Antidepressants - mirtazapine Failed - 10/04/2018 12:38 PM      Failed - Total Cholesterol in normal range and within 360 days    Cholesterol, Total  Date Value Ref Range Status  01/20/2018 211 (H) 100 - 199 mg/dL Final         Failed - Valid encounter within last 6 months    Recent Outpatient Visits          8 months ago Annual physical exam   Primary Care at [facility], Madelaine Bhat, PA-C   1 year ago Transient alteration of awareness   Primary Care at [facility], Myrle Sheng, MD   1 year ago Routine physical examination   Primary Care at [facility], Myrle Sheng, MD   2 years ago Type 2 diabetes mellitus without complication, without long-term current use of insulin [facility])   Primary Care at [facility], [place], [place]   3 years ago Routine physical examination   Primary Care at [facility], Myrle Sheng, MD             Passed - AST in normal range and within 360 days    AST  Date Value Ref Range Status  01/20/2018 20 0 - 40 IU/L Final         Passed - ALT in normal range and within 360 days    ALT  Date Value Ref Range Status  01/20/2018 20 0 - 32 IU/L Final         Passed - Triglycerides in normal range and within 360 days    Triglycerides  Date Value Ref Range Status  01/20/2018 126 0 - 149 mg/dL Final         Passed - WBC in normal range and within 360 days    WBC  Date Value Ref Range Status  01/20/2018 6.2 3.4 - 10.8 x10E3/uL Final  09/09/2015 7.0 4.0 - 10.5 K/uL Final

## 2018-11-02 ENCOUNTER — Encounter: Payer: Self-pay | Admitting: Emergency Medicine

## 2018-11-02 ENCOUNTER — Telehealth (INDEPENDENT_AMBULATORY_CARE_PROVIDER_SITE_OTHER): Payer: BLUE CROSS/BLUE SHIELD | Admitting: Emergency Medicine

## 2018-11-02 ENCOUNTER — Other Ambulatory Visit: Payer: Self-pay

## 2018-11-02 DIAGNOSIS — E119 Type 2 diabetes mellitus without complications: Secondary | ICD-10-CM

## 2018-11-02 DIAGNOSIS — J302 Other seasonal allergic rhinitis: Secondary | ICD-10-CM | POA: Diagnosis not present

## 2018-11-02 DIAGNOSIS — Z1211 Encounter for screening for malignant neoplasm of colon: Secondary | ICD-10-CM

## 2018-11-02 DIAGNOSIS — G4700 Insomnia, unspecified: Secondary | ICD-10-CM

## 2018-11-02 DIAGNOSIS — Z1239 Encounter for other screening for malignant neoplasm of breast: Secondary | ICD-10-CM

## 2018-11-02 MED ORDER — PREDNISONE 20 MG PO TABS: 20.0000 mg | ORAL_TABLET | Freq: Every day | ORAL | 0 refills | Status: AC

## 2018-11-02 MED ORDER — CETIRIZINE HCL 10 MG PO TABS: 10.0000 mg | ORAL_TABLET | Freq: Every day | ORAL | 11 refills | Status: DC

## 2018-11-02 MED ORDER — ALBUTEROL SULFATE HFA 108 (90 BASE) MCG/ACT IN AERS: 2.0000 | INHALATION_SPRAY | Freq: Four times a day (QID) | RESPIRATORY_TRACT | 5 refills | Status: AC | PRN

## 2018-11-02 MED ORDER — MIRTAZAPINE 7.5 MG PO TABS: 7.5000 mg | ORAL_TABLET | Freq: Every day | ORAL | 3 refills | Status: AC

## 2018-11-02 NOTE — Progress Notes (Signed)
Contacted patient to triage for appointment. Patient is transferring care she is a former patient of Dr Katrinka Blazing. Patient was a diabetic before, but not anymore because her numbers are within range. Patient states she does not take medicine for diabetes. Patient states the doctor has talk to her about diet restrictions.  Patient states she needs Remeron medication refill. Also, patient states she is having seasonal allergies sneezing and coughing up yellow phlegm.

## 2018-11-02 NOTE — Progress Notes (Signed)
Lab Results  Component Value Date   HGBA1C 6.3 (H) 01/20/2018      Telemedicine Encounter- SOAP NOTE Established Patient  This telephone encounter was conducted with the patient's (or proxy's) verbal consent via audio telecommunications: yes/no: Yes Patient was instructed to have this encounter in a suitably private space; and to only have persons present to whom they give permission to participate. In addition, patient identity was confirmed by use of name plus two identifiers (DOB and address).  I discussed the limitations, risks, security and privacy concerns of performing an evaluation and management service by telephone and the availability of in person appointments. I also discussed with the patient that there may be a patient responsible charge related to this service. The patient expressed understanding and agreed to proceed.  I spent a total of TIME; 0 MIN TO 60 MIN: 20 minutes talking with the patient or their proxy.  No chief complaint on file. Follow-up of chronic medical conditions  Subjective   Jodi Parker is a 53 y.o. female established patient. Telephone visit today for follow-up of chronic medical conditions. Patient has a history of diabetes but is presently on no medications.  States her numbers were doing well so she was told to treated with diet and exercise.  Has a history of seasonal allergies and is having exacerbation of her symptoms with runny nose and dry cough with intermittent wheezing.  Non-smoker and no history of asthma. Last year she was started on Remeron 7.5 mg at bedtime, states it is working very well and needs a refill. No other complaints or medical concerns today. History obtained using help of an interpreter for [place] language.   HPI   Patient Active Problem List   Diagnosis Date Noted  . Pure hypercholesterolemia 01/14/2017  . Type 2 diabetes mellitus without complication, without long-term current use of insulin (HCC) 01/14/2017  .  Menopause 01/14/2017    Past Medical History:  Diagnosis Date  . Allergy    Claritin PRN.  . Depression   . Diabetes mellitus without complication (HCC)   . Hyperlipidemia     Current Outpatient Medications  Medication Sig Dispense Refill  . cholecalciferol (VITAMIN D) 1000 UNITS tablet Take 1,000 Units by mouth daily.    . fish oil-omega-3 fatty acids 1000 MG capsule Take 1 g by mouth 2 (two) times daily.    . mirtazapine (REMERON) 7.5 MG tablet Take 1 tablet (7.5 mg total) by mouth at bedtime. 30 tablet 3  . albuterol (VENTOLIN HFA) 108 (90 Base) MCG/ACT inhaler Inhale 2 puffs into the lungs every 6 (six) hours as needed for wheezing or shortness of breath. 1 Inhaler 5  . cetirizine (ZYRTEC) 10 MG tablet Take 1 tablet (10 mg total) by mouth daily for 7 days. 10 tablet 11  . cyclobenzaprine (FLEXERIL) 10 MG tablet Take 1 tablet (10 mg total) by mouth 3 (three) times daily as needed for muscle spasms. 30 tablet 0  . predniSONE (DELTASONE) 20 MG tablet Take 1 tablet (20 mg total) by mouth daily with breakfast for 5 days. 5 tablet 0   No current facility-administered medications for this visit.     No Known Allergies  Social History   Socioeconomic History  . Marital status: Married    Spouse name: Not on file  . Number of children: Not on file  . Years of education: Not on file  . Highest education level: Not on file  Occupational History  . Not on file  Social  Needs  . Financial resource strain: Not on file  . Food insecurity:    Worry: Not on file    Inability: Not on file  . Transportation needs:    Medical: Not on file    Non-medical: Not on file  Tobacco Use  . Smoking status: Never Smoker  . Smokeless tobacco: Never Used  Substance and Sexual Activity  . Alcohol use: Yes    Alcohol/week: 3.0 standard drinks    Types: 3 Standard drinks or equivalent per week    Comment: wine social use  . Drug use: No  . Sexual activity: Yes    Partners: Male    Birth  control/protection: Post-menopausal  Lifestyle  . Physical activity:    Days per week: Not on file    Minutes per session: Not on file  . Stress: Not on file  Relationships  . Social connections:    Talks on phone: Not on file    Gets together: Not on file    Attends religious service: Not on file    Active member of club or organization: Not on file    Attends meetings of clubs or organizations: Not on file    Relationship status: Not on file  . Intimate partner violence:    Fear of current or ex partner: Not on file    Emotionally abused: Not on file    Physically abused: Not on file    Forced sexual activity: Not on file  Other Topics Concern  . Not on file  Social History Narrative   Marital status: married x 30 years; from [place]; moved to [place] 22 years ago.      Children: 4 children; 1 grandchild.       Lives: with husband, 1 daughter.      Employment: works for [employer] x 23 years.      Tobacco; none      Alcohol: wine 3 glasses per week.      Drugs: none      Exercise:  Yes; walking daily for 30 minutes.      Seatbelt: 100%; does not drive.      Guns:  None    Review of Systems  Constitutional: Negative.  Negative for chills, fever and malaise/fatigue.  HENT: Negative.  Negative for congestion, nosebleeds and sore throat.   Eyes: Negative for blurred vision and double vision.  Respiratory: Negative.  Negative for cough and shortness of breath.   Cardiovascular: Negative.  Negative for chest pain and palpitations.  Gastrointestinal: Negative for abdominal pain, diarrhea, nausea and vomiting.  Genitourinary: Negative for dysuria and hematuria.  Musculoskeletal: Negative for myalgias and neck pain.  Skin: Negative.   Neurological: Negative for dizziness and headaches.  All other systems reviewed and are negative.   Objective   Vitals as reported by the patient: None available.  Weighs 123 pounds, self-reported. Awake and oriented x3 in no apparent  respiratory distress while talking to me on the phone. There were no vitals filed for this visit.  Diagnoses and all orders for this visit:  Seasonal allergies -     cetirizine (ZYRTEC) 10 MG tablet; Take 1 tablet (10 mg total) by mouth daily for 7 days. -     predniSONE (DELTASONE) 20 MG tablet; Take 1 tablet (20 mg total) by mouth daily with breakfast for 5 days. -     albuterol (VENTOLIN HFA) 108 (90 Base) MCG/ACT inhaler; Inhale 2 puffs into the lungs every 6 (six) hours as needed  for wheezing or shortness of breath.  Breast cancer screening -     MM Digital Screening; Future  Colon cancer screening -     Ambulatory referral to Gastroenterology  Type 2 diabetes mellitus without complication, without long-term current use of insulin (HCC) -     Ambulatory referral to Ophthalmology  Insomnia, unspecified type -     mirtazapine (REMERON) 7.5 MG tablet; Take 1 tablet (7.5 mg total) by mouth at bedtime.   Clinically stable.  No medical concerns identified at this time. We will treat the seasonal allergies with medications. Blood work in the near future. Office visit in 3 months.  I discussed the assessment and treatment plan with the patient. The patient was provided an opportunity to ask questions and all were answered. The patient agreed with the plan and demonstrated an understanding of the instructions.   The patient was advised to call back or seek an in-person evaluation if the symptoms worsen or if the condition fails to improve as anticipated.  I provided 20 minutes of non-face-to-face time during this encounter.  Georgina Quint, MD  Primary Care at [facility]

## 2018-12-28 LAB — HM DIABETES EYE EXAM

## 2019-01-04 ENCOUNTER — Encounter: Payer: Self-pay | Admitting: *Deleted

## 2019-01-09 ENCOUNTER — Encounter: Payer: Self-pay | Admitting: *Deleted

## 2019-09-10 ENCOUNTER — Other Ambulatory Visit: Payer: Self-pay

## 2019-09-10 ENCOUNTER — Ambulatory Visit (AMBULATORY_SURGERY_CENTER): Payer: Self-pay | Admitting: *Deleted

## 2019-09-10 VITALS — Temp 97.1°F | Ht 60.0 in | Wt 123.0 lb

## 2019-09-10 DIAGNOSIS — Z1211 Encounter for screening for malignant neoplasm of colon: Secondary | ICD-10-CM

## 2019-09-10 DIAGNOSIS — Z01818 Encounter for other preprocedural examination: Secondary | ICD-10-CM

## 2019-09-10 MED ORDER — SUPREP BOWEL PREP KIT 17.5-3.13-1.6 GM/177ML PO SOLN: 1.0000 | Freq: Once | ORAL | 0 refills | Status: AC

## 2019-09-10 NOTE — Progress Notes (Signed)
[place] interpreter in Pv with pt today - temp 96.2 After Pv pt states she understands her instructions that were given in [place] today per interpreter   No egg or soy allergy known to patient  No issues with past sedation with any surgeries  or procedures, no intubation problems  No diet pills per patient No home 02 use per patient  No blood thinners per patient  Pt denies issues with constipation  No A fib or A flutter  EMMI video sent to pt's e mail   Due to the COVID-19 pandemic we are asking patients to follow these guidelines. Please only bring one care partner. Please be aware that your care partner may wait in the car in the parking lot or if they feel like they will be too hot to wait in the car, they may wait in the lobby on the 4th floor. All care partners are required to wear a mask the entire time (we do not have any that we can provide them), they need to practice social distancing, and we will do a Covid check for all patient's and care partners when you arrive. Also we will check their temperature and your temperature. If the care partner waits in their car they need to stay in the parking lot the entire time and we will call them on their cell phone when the patient is ready for discharge so they can bring the car to the front of the building. Also all patient's will need to wear a mask into building.

## 2019-09-18 ENCOUNTER — Encounter: Payer: BLUE CROSS/BLUE SHIELD | Admitting: Gastroenterology

## 2019-09-21 ENCOUNTER — Other Ambulatory Visit: Payer: Self-pay | Admitting: Gastroenterology

## 2019-09-21 ENCOUNTER — Other Ambulatory Visit: Payer: Self-pay

## 2019-09-21 ENCOUNTER — Ambulatory Visit (INDEPENDENT_AMBULATORY_CARE_PROVIDER_SITE_OTHER): Payer: BC Managed Care – PPO

## 2019-09-21 DIAGNOSIS — Z1159 Encounter for screening for other viral diseases: Secondary | ICD-10-CM

## 2019-09-22 LAB — SARS CORONAVIRUS 2 (TAT 6-24 HRS): SARS Coronavirus 2: NEGATIVE

## 2019-09-25 ENCOUNTER — Encounter: Payer: Self-pay | Admitting: Gastroenterology

## 2019-09-25 ENCOUNTER — Other Ambulatory Visit: Payer: Self-pay

## 2019-09-25 ENCOUNTER — Ambulatory Visit (AMBULATORY_SURGERY_CENTER): Payer: BC Managed Care – PPO | Admitting: Gastroenterology

## 2019-09-25 VITALS — BP 107/71 | HR 63 | Temp 96.2°F | Resp 13 | Ht 60.0 in | Wt 123.0 lb

## 2019-09-25 DIAGNOSIS — Z1211 Encounter for screening for malignant neoplasm of colon: Secondary | ICD-10-CM

## 2019-09-25 DIAGNOSIS — K5730 Diverticulosis of large intestine without perforation or abscess without bleeding: Secondary | ICD-10-CM

## 2019-09-25 MED ORDER — SODIUM CHLORIDE 0.9 % IV SOLN: 500.0000 mL | Freq: Once | INTRAVENOUS | Status: DC

## 2019-09-25 NOTE — Patient Instructions (Signed)
Read all of the handouts given to you by your recovery room nurse.   Thank-you for choosing us for your healthcare needs today.  YOU HAD AN ENDOSCOPIC PROCEDURE TODAY AT THE [facility]:   Refer to the procedure report that was given to you for any specific questions about what was found during the examination.  If the procedure report does not answer your questions, please call your gastroenterologist to clarify.  If you requested that your care partner not be given the details of your procedure findings, then the procedure report has been included in a sealed envelope for you to review at your convenience later.  YOU SHOULD EXPECT: Some feelings of bloating in the abdomen. Passage of more gas than usual.  Walking can help get rid of the air that was put into your GI tract during the procedure and reduce the bloating. If you had a lower endoscopy (such as a colonoscopy or flexible sigmoidoscopy) you may notice spotting of blood in your stool or on the toilet paper. If you underwent a bowel prep for your procedure, you may not have a normal bowel movement for a few days.  Please Note:  You might notice some irritation and congestion in your nose or some drainage.  This is from the oxygen used during your procedure.  There is no need for concern and it should clear up in a day or so.  SYMPTOMS TO REPORT IMMEDIATELY:   Following lower endoscopy (colonoscopy or flexible sigmoidoscopy):  Excessive amounts of blood in the stool  Significant tenderness or worsening of abdominal pains  Swelling of the abdomen that is new, acute  Fever of 100F or higher   For urgent or emergent issues, a gastroenterologist can be reached at any hour by calling [phone]. Do not use MyChart messaging for urgent concerns.    DIET:  We do recommend a small meal at first, but then you may proceed to your regular diet.  Drink plenty of fluids but you should avoid alcoholic beverages for 24  hours.  ACTIVITY:  You should plan to take it easy for the rest of today and you should NOT DRIVE or use heavy machinery until tomorrow (because of the sedation medicines used during the test).    FOLLOW UP: Our staff will call the number listed on your records 48-72 hours following your procedure to check on you and address any questions or concerns that you may have regarding the information given to you following your procedure. If we do not reach you, we will leave a message.  We will attempt to reach you two times.  During this call, we will ask if you have developed any symptoms of COVID 19. If you develop any symptoms (ie: fever, flu-like symptoms, shortness of breath, cough etc.) before then, please call [phone].  If you test positive for Covid 19 in the 2 weeks post procedure, please call and report this information to us.    If any biopsies were taken you will be contacted by phone or by letter within the next 1-3 weeks.  Please call us at [phone] if you have not heard about the biopsies in 3 weeks.    SIGNATURES/CONFIDENTIALITY: You and/or your care partner have signed paperwork which will be entered into your electronic medical record.  These signatures attest to the fact that that the information above on your After Visit Summary has been reviewed and is understood.  Full responsibility of the confidentiality of this discharge   information lies with you and/or your care-partner. 

## 2019-09-25 NOTE — Progress Notes (Signed)
Report given to PACU, vss 

## 2019-09-25 NOTE — Progress Notes (Signed)
Pt's states no medical or surgical changes since previsit or office visit.  Temp- -LC  Vs-DT  Interpreter present w/ pt during adm process- TINA

## 2019-09-25 NOTE — Op Note (Signed)
[facility] Patient Name: Jodi Parker Procedure Date: 09/25/2019 10:06 AM MRN: [id number] Endoscopist: Doristine Locks , MD Age: 54 Referring MD:  Date of Birth: 08/17/1965 Gender: Female Account #: [id number] Procedure:                Colonoscopy Indications:              Screening for colorectal malignant neoplasm, This                            is the patient's first colonoscopy Medicines:                Monitored Anesthesia Care Procedure:                Pre-Anesthesia Assessment:                           - Prior to the procedure, a History and Physical                            was performed, and patient medications and                            allergies were reviewed. The patient's tolerance of                            previous anesthesia was also reviewed. The risks                            and benefits of the procedure and the sedation                            options and risks were discussed with the patient.                            All questions were answered, and informed consent                            was obtained. Prior Anticoagulants: The patient has                            taken no previous anticoagulant or antiplatelet                            agents. ASA Grade Assessment: II - A patient with                            mild systemic disease. After reviewing the risks                            and benefits, the patient was deemed in                            satisfactory condition to undergo the procedure.  After obtaining informed consent, the colonoscope                            was passed under direct vision. Throughout the                            procedure, the patient's blood pressure, pulse, and                            oxygen saturations were monitored continuously. The                            Colonoscope was introduced through the anus and                            advanced to the the terminal  ileum. The colonoscopy                            was performed without difficulty. The patient                            tolerated the procedure well. The quality of the                            bowel preparation was excellent. The terminal                            ileum, ileocecal valve, appendiceal orifice, and                            rectum were photographed. Scope In: 10:14:03 AM Scope Out: 10:23:33 AM Scope Withdrawal Time: 0 hours 7 minutes 47 seconds  Total Procedure Duration: 0 hours 9 minutes 30 seconds  Findings:                 The perianal and digital rectal examinations were                            normal.                           Two small-mouthed diverticula were found in the                            ascending colon.                           The exam was otherwise normal throughout the                            remainder of the colon.                           The terminal ileum appeared normal.  The retroflexed view of the distal rectum and anal                            verge was normal and showed no anal or rectal                            abnormalities. Complications:            No immediate complications. Estimated Blood Loss:     Estimated blood loss: none. Impression:               - Diverticulosis in the ascending colon.                           - The examined portion of the ileum was normal.                           - The distal rectum and anal verge are normal on                            retroflexion view.                           - No specimens collected. Recommendation:           - Patient has a contact number available for                            emergencies. The signs and symptoms of potential                            delayed complications were discussed with the                            patient. Return to normal activities tomorrow.                            Written discharge instructions were  provided to the                            patient.                           - Resume previous diet.                           - Continue present medications.                           - Repeat colonoscopy in 10 years for screening                            purposes.                           - Return to GI office PRN. Gerrit Heck, MD 09/25/2019 10:26:53 AM

## 2019-09-27 ENCOUNTER — Telehealth: Payer: Self-pay

## 2019-09-27 ENCOUNTER — Telehealth: Payer: Self-pay | Admitting: *Deleted

## 2019-09-27 NOTE — Telephone Encounter (Signed)
Left message on follow up call. 

## 2019-09-27 NOTE — Telephone Encounter (Signed)
Unable to leave message,mailbox full. 

## 2019-11-11 ENCOUNTER — Other Ambulatory Visit: Payer: Self-pay | Admitting: Emergency Medicine

## 2019-11-11 DIAGNOSIS — J302 Other seasonal allergic rhinitis: Secondary | ICD-10-CM

## 2019-11-16 ENCOUNTER — Other Ambulatory Visit: Payer: Self-pay | Admitting: Emergency Medicine

## 2019-11-16 DIAGNOSIS — J302 Other seasonal allergic rhinitis: Secondary | ICD-10-CM

## 2019-11-16 NOTE — Telephone Encounter (Signed)
Requested medication (s) are due for refill today: Yes  Requested medication (s) are on the active medication list: Yes  Last refill:  09/2018  Future visit scheduled: No  Notes to clinic:  Expired RX, unable to refill per protocol     Requested Prescriptions  Pending Prescriptions Disp Refills   cetirizine (ZYRTEC) 10 MG tablet [Pharmacy Med Name: CETIRIZINE HCL 10 MG TABLET] 30 tablet 3    Sig: TAKE 1 TABLET BY MOUTH DAILY FOR 7 DAYS      Ear, Nose, and Throat:  Antihistamines Failed - 11/16/2019  6:03 PM      Failed - Valid encounter within last 12 months    Recent Outpatient Visits           1 year ago Seasonal allergies   [facility] at [place], Eilleen Kempf, MD   1 year ago Annual physical exam   [facility] at [facility], Madelaine Bhat, PA-C   2 years ago Transient alteration of awareness   Primary Care at [facility], Myrle Sheng, MD   2 years ago Routine physical examination   Primary Care at [facility], Myrle Sheng, MD   3 years ago Type 2 diabetes mellitus without complication, without long-term current use of insulin [facility])   Primary Care at [facility], [place], [place]

## 2019-11-19 NOTE — Telephone Encounter (Signed)
Left voicemail for patient to return, an office visit is required. Requesting cetirizine .

## 2019-12-19 ENCOUNTER — Other Ambulatory Visit: Payer: Self-pay | Admitting: Emergency Medicine

## 2019-12-19 DIAGNOSIS — J302 Other seasonal allergic rhinitis: Secondary | ICD-10-CM

## 2019-12-19 NOTE — Telephone Encounter (Signed)
Requested medications are due for refill today?  Yes  Requested medications are on active medication list?  Yes  Last Refill:   11/19/2019  # 30 with no refills.  This was a courtesy refill.    Future visit scheduled?  No   Notes to Clinic:  Medication failed RX refill protocol due to no valid encounter in the past 12 months.

## 2021-03-06 ENCOUNTER — Ambulatory Visit: Payer: BC Managed Care – PPO | Admitting: Nurse Practitioner

## 2021-03-12 ENCOUNTER — Other Ambulatory Visit: Payer: Self-pay

## 2021-03-12 ENCOUNTER — Encounter: Payer: Self-pay | Admitting: Nurse Practitioner

## 2021-03-12 ENCOUNTER — Ambulatory Visit (INDEPENDENT_AMBULATORY_CARE_PROVIDER_SITE_OTHER): Payer: BC Managed Care – PPO | Admitting: Nurse Practitioner

## 2021-03-12 VITALS — BP 136/82 | HR 78 | Temp 98.1°F | Ht 61.0 in | Wt 127.0 lb

## 2021-03-12 DIAGNOSIS — Z1329 Encounter for screening for other suspected endocrine disorder: Secondary | ICD-10-CM

## 2021-03-12 DIAGNOSIS — Z23 Encounter for immunization: Secondary | ICD-10-CM | POA: Diagnosis not present

## 2021-03-12 DIAGNOSIS — E119 Type 2 diabetes mellitus without complications: Secondary | ICD-10-CM | POA: Diagnosis not present

## 2021-03-12 DIAGNOSIS — Z0000 Encounter for general adult medical examination without abnormal findings: Secondary | ICD-10-CM

## 2021-03-12 DIAGNOSIS — Z13220 Encounter for screening for lipoid disorders: Secondary | ICD-10-CM

## 2021-03-12 DIAGNOSIS — Z1231 Encounter for screening mammogram for malignant neoplasm of breast: Secondary | ICD-10-CM

## 2021-03-12 DIAGNOSIS — Z1159 Encounter for screening for other viral diseases: Secondary | ICD-10-CM

## 2021-03-12 DIAGNOSIS — Z7689 Persons encountering health services in other specified circumstances: Secondary | ICD-10-CM | POA: Diagnosis not present

## 2021-03-12 DIAGNOSIS — D508 Other iron deficiency anemias: Secondary | ICD-10-CM

## 2021-03-12 LAB — POCT URINALYSIS DIP (CLINITEK)
Bilirubin, UA: NEGATIVE
Blood, UA: NEGATIVE
Glucose, UA: NEGATIVE mg/dL
Ketones, POC UA: NEGATIVE mg/dL
Leukocytes, UA: NEGATIVE
Nitrite, UA: NEGATIVE
POC PROTEIN,UA: NEGATIVE
Spec Grav, UA: 1.015 (ref 1.010–1.025)
Urobilinogen, UA: 0.2 E.U./dL
pH, UA: 5.5 (ref 5.0–8.0)

## 2021-03-12 LAB — POCT GLYCOSYLATED HEMOGLOBIN (HGB A1C)
HbA1c POC (<> result, manual entry): 6.3 % (ref 4.0–5.6)
HbA1c, POC (controlled diabetic range): 6.3 % (ref 0.0–7.0)
HbA1c, POC (prediabetic range): 6.3 % (ref 5.7–6.4)
Hemoglobin A1C: 6.3 % — AB (ref 4.0–5.6)

## 2021-03-12 LAB — GLUCOSE, POCT (MANUAL RESULT ENTRY): POC Glucose: 116 mg/dl — AB (ref 70–99)

## 2021-03-12 NOTE — Progress Notes (Signed)
[facility] Patient [facility] [address] [place], [place]  [address] Phone:  [phone]   Fax:  [phone]   New Patient Office Visit  Subjective:  Patient ID: Keianna Signer Vidana, female    DOB: 1965-07-14  Age: 55 y.o. MRN: [id number]  CC:  Chief Complaint  Patient presents with   Establish Care    Pt is here to establish care and get an annual physical. Pt states she is a diabetic and has been managed in the past by another provider in the past at the [facility]. Pt states that at night she has been having cramping and pains in both her legs x 6 to 7 months. Pt is not on any diabetic medications. Pt is here with her daughter and the interpreter today.    HPI Meher T Kratzke presents toe establish care. She  has a past medical history of Allergy, Anemia, Depression, Diabetes mellitus without complication (HCC), Hyperlipidemia, and Seizures (HCC).   She is in today with her daughter for a new pt visit and is requesting a full exam. Her pap was completed in 2020 along with mammogram. Family history of Breast cancer paternal aunt age around 47-50.  Diabetes Mellitus Patient presents for follow up of diabetes. She has seen Current symptoms include: none. Symptoms have stabilized. Patient denies foot ulcerations, hyperglycemia, hypoglycemia , increased appetite, nausea, paresthesia of the feet, polydipsia, polyuria, visual disturbances, vomiting, and weight loss. Evaluation to date has included: fasting blood sugar.  Home sugars: patient does not check sugars. Current treatment: more intensive attention to diet which has been effective, low cholesterol diet which has been effective, and increased aerobic exercise which has been effective.   Past Medical History:  Diagnosis Date   Allergy    Claritin PRN.   Anemia    on iron daily    Depression    Diabetes mellitus without complication (HCC)    no medications- diet controlled    Hyperlipidemia    Seizures (HCC)    2018 in [place] ?  seizure after syncope- took meds x 1 yr- none since - no other episodes - MD here said not a seizure so she isnt sure     Past Surgical History:  Procedure Laterality Date   NO PAST SURGERIES      Family History  Problem Relation Age of Onset   Colon polyps Neg Hx    Colon cancer Neg Hx    Esophageal cancer Neg Hx    Rectal cancer Neg Hx    Stomach cancer Neg Hx     Social History   Socioeconomic History   Marital status: Married    Spouse name: Not on file   Number of children: Not on file   Years of education: Not on file   Highest education level: Not on file  Occupational History   Not on file  Tobacco Use   Smoking status: Never   Smokeless tobacco: Never  Vaping Use   Vaping Use: Never used  Substance and Sexual Activity   Alcohol use: Yes    Alcohol/week: 3.0 standard drinks    Types: 3 Standard drinks or equivalent per week    Comment: wine social use   Drug use: No   Sexual activity: Yes    Partners: Male    Birth control/protection: Post-menopausal  Other Topics Concern   Not on file  Social History Narrative   Marital status: married x 30 years; from [place]; moved to [place]  22 years ago.      Children: 4 children; 1 grandchild.       Lives: with husband, 1 daughter.      Employment: works for [employer] x 23 years.      Tobacco; none      Alcohol: wine 3 glasses per week.      Drugs: none      Exercise:  Yes; walking daily for 30 minutes.      Seatbelt: 100%; does not drive.      Guns:  None   Social Determinants of Corporate investment banker Strain: Not on file  Food Insecurity: Not on file  Transportation Needs: Not on file  Physical Activity: Not on file  Stress: Not on file  Social Connections: Not on file  Intimate Partner Violence: Not on file    ROS Review of Systems  Constitutional: Negative.   HENT: Negative.    Eyes:  Positive for visual disturbance (wears glasses).  Respiratory: Negative.    Cardiovascular:  Negative.   Gastrointestinal: Negative.   Endocrine: Negative.   Genitourinary:  Positive for frequency (no change). Negative for hematuria.       Nocturia x2 LMP 2014-2015  Musculoskeletal:  Positive for joint swelling (knee comes and goes).  Neurological:  Negative for dizziness and headaches.   Objective:   Today's Vitals: BP 136/82   Pulse 78   Temp 98.1 F (36.7 C)   Ht 5\' 1"  (1.549 m)   Wt 127 lb (57.6 kg)   SpO2 97%   BMI 24.00 kg/m   Physical Exam Constitutional:      General: She is not in acute distress.    Appearance: Normal appearance. She is normal weight. She is not ill-appearing, toxic-appearing or diaphoretic.  HENT:     Head: Normocephalic and atraumatic.     Right Ear: Tympanic membrane normal.     Left Ear: Tympanic membrane normal.     Nose: Nose normal.     Mouth/Throat:     Mouth: Mucous membranes are moist.  Eyes:     Extraocular Movements: Extraocular movements intact.     Pupils: Pupils are equal, round, and reactive to light.  Cardiovascular:     Rate and Rhythm: Normal rate and regular rhythm.     Pulses: Normal pulses.     Heart sounds: Normal heart sounds.  Pulmonary:     Effort: Pulmonary effort is normal.     Breath sounds: Normal breath sounds.  Abdominal:     General: Abdomen is flat. Bowel sounds are normal.     Palpations: Abdomen is soft.  Musculoskeletal:        General: Normal range of motion.     Cervical back: Normal range of motion.     Right lower leg: No edema.     Left lower leg: No edema.  Skin:    General: Skin is warm and dry.     Capillary Refill: Capillary refill takes less than 2 seconds.  Neurological:     General: No focal deficit present.     Mental Status: She is alert and oriented to person, place, and time.  Psychiatric:        Mood and Affect: Mood normal.        Behavior: Behavior normal.        Thought Content: Thought content normal.        Judgment: Judgment normal.    Assessment & Plan:    Problem List Items Addressed  This Visit       Endocrine   Type 2 diabetes mellitus without complication, without long-term current use of insulin (HCC) Stable Encouraged continuing with lifestyle modification with healthy diet (fewer calories, more high fiber foods, whole grains and non-starchy vegetables, lower fat meat and fish, low-fat diary include healthy oils) regular exercise (physical activity) Opthalmologic exam discussed  Regular dental visits encouraged Home BP monitoring also encouraged goal <130/80    Relevant Orders   Microalbumin, urine   Comp. Metabolic Panel (12)   Other Visit Diagnoses     Health care maintenance    -  Primary   Relevant Orders   POCT URINALYSIS DIP (CLINITEK) (Completed)   HgB A1c (Completed)   Glucose (CBG) (Completed)   Encounter for hepatitis C screening test for low risk patient       Relevant Orders   Hepatitis C antibody   Encounter to establish care    Discussed female health maintenance; SBE, annual CBE, PAP test Discussed general safety in vehicle and COVID Discussed regular hydration with water Discussed healthy diet and exercise and weight management Discussed sexual health  Discussed mental health Encouraged to call our office for an appointment with in ongoing concerns for questions.      Screening for thyroid disorder       Relevant Orders   TSH   Screening for cholesterol level       Relevant Orders   Lipid panel   Other iron deficiency anemia       Relevant Orders   CBC with Differential/Platelet   Encounter for screening mammogram for malignant neoplasm of breast       Relevant Orders   MM 3D SCREEN BREAST BILATERAL   Flu vaccine need           Outpatient Encounter Medications as of 03/12/2021  Medication Sig   cetirizine (ZYRTEC) 10 MG tablet Take 1 tablet (10 mg total) by mouth daily.   cholecalciferol (VITAMIN D) 1000 UNITS tablet Take 1,000 Units by mouth daily.   fish oil-omega-3 fatty acids 1000 MG  capsule Take 1 g by mouth 2 (two) times daily.   albuterol (VENTOLIN HFA) 108 (90 Base) MCG/ACT inhaler Inhale 2 puffs into the lungs every 6 (six) hours as needed for wheezing or shortness of breath. (Patient not taking: No sig reported)   cyclobenzaprine (FLEXERIL) 10 MG tablet Take 1 tablet (10 mg total) by mouth 3 (three) times daily as needed for muscle spasms. (Patient not taking: No sig reported)   ferrous sulfate 325 (65 FE) MG EC tablet Take 325 mg by mouth 3 (three) times daily with meals. (Patient not taking: Reported on 03/12/2021)   mirtazapine (REMERON) 7.5 MG tablet Take 1 tablet (7.5 mg total) by mouth at bedtime. (Patient not taking: No sig reported)   triamcinolone cream (KENALOG) 0.1 % APPLY TO AFFECTED AREA TWICE A DAY (Patient not taking: Reported on 03/12/2021)   No facility-administered encounter medications on file as of 03/12/2021.    Follow-up: Return in about 6 months (around 09/09/2021) for follow up [id number].   Barbette Merino, NP

## 2021-03-12 NOTE — Patient Instructions (Addendum)
Health Maintenance, Female Adopting a healthy lifestyle and getting preventive care are important in promoting health and wellness. Ask your health care provider about: The right schedule for you to have regular tests and exams. Things you can do on your own to prevent diseases and keep yourself healthy. What should I know about diet, weight, and exercise? Eat a healthy diet  Eat a diet that includes plenty of vegetables, fruits, low-fat dairy products, and lean protein. Do not eat a lot of foods that are high in solid fats, added sugars, or sodium. Maintain a healthy weight Body mass index (BMI) is used to identify weight problems. It estimates body fat based on height and weight. Your health care provider can help determine your BMI and help you achieve or maintain a healthy weight. Get regular exercise Get regular exercise. This is one of the most important things you can do for your health. Most adults should: Exercise for at least 150 minutes each week. The exercise should increase your heart rate and make you sweat (moderate-intensity exercise). Do strengthening exercises at least twice a week. This is in addition to the moderate-intensity exercise. Spend less time sitting. Even light physical activity can be beneficial. Watch cholesterol and blood lipids Have your blood tested for lipids and cholesterol at 56 years of age, then have this test every 5 years. Have your cholesterol levels checked more often if: Your lipid or cholesterol levels are high. You are older than 56 years of age. You are at high risk for heart disease. What should I know about cancer screening? Depending on your health history and family history, you may need to have cancer screening at various ages. This may include screening for: Breast cancer. Cervical cancer. Colorectal cancer. Skin cancer. Lung cancer. What should I know about heart disease, diabetes, and high blood pressure? Blood pressure and heart  disease High blood pressure causes heart disease and increases the risk of stroke. This is more likely to develop in people who have high blood pressure readings, are of African descent, or are overweight. Have your blood pressure checked: Every 3-5 years if you are 18-39 years of age. Every year if you are 40 years old or older. Diabetes Have regular diabetes screenings. This checks your fasting blood sugar level. Have the screening done: Once every three years after age 40 if you are at a normal weight and have a low risk for diabetes. More often and at a younger age if you are overweight or have a high risk for diabetes. What should I know about preventing infection? Hepatitis B If you have a higher risk for hepatitis B, you should be screened for this virus. Talk with your health care provider to find out if you are at risk for hepatitis B infection. Hepatitis C Testing is recommended for: Everyone born from 1945 through 1965. Anyone with known risk factors for hepatitis C. Sexually transmitted infections (STIs) Get screened for STIs, including gonorrhea and chlamydia, if: You are sexually active and are younger than 56 years of age. You are older than 56 years of age and your health care provider tells you that you are at risk for this type of infection. Your sexual activity has changed since you were last screened, and you are at increased risk for chlamydia or gonorrhea. Ask your health care provider if you are at risk. Ask your health care provider about whether you are at high risk for HIV. Your health care provider may recommend a prescription medicine   to help prevent HIV infection. If you choose to take medicine to prevent HIV, you should first get tested for HIV. You should then be tested every 3 months for as long as you are taking the medicine. Pregnancy If you are about to stop having your period (premenopausal) and you may become pregnant, seek counseling before you get  pregnant. Take 400 to 800 micrograms (mcg) of folic acid every day if you become pregnant. Ask for birth control (contraception) if you want to prevent pregnancy. Osteoporosis and menopause Osteoporosis is a disease in which the bones lose minerals and strength with aging. This can result in bone fractures. If you are 28 years old or older, or if you are at risk for osteoporosis and fractures, ask your health care provider if you should: Be screened for bone loss. Take a calcium or vitamin D supplement to lower your risk of fractures. Be given hormone replacement therapy (HRT) to treat symptoms of menopause. Follow these instructions at home: Lifestyle Do not use any products that contain nicotine or tobacco, such as cigarettes, e-cigarettes, and chewing tobacco. If you need help quitting, ask your health care provider. Do not use street drugs. Do not share needles. Ask your health care provider for help if you need support or information about quitting drugs. Alcohol use Do not drink alcohol if: Your health care provider tells you not to drink. You are pregnant, may be pregnant, or are planning to become pregnant. If you drink alcohol: Limit how much you use to 0-1 drink a day. Limit intake if you are breastfeeding. Be aware of how much alcohol is in your drink. In the [place] one drink equals one 12 oz bottle of beer (355 mL), one 5 oz glass of wine (148 mL), or one 1 oz glass of hard liquor (44 mL). General instructions Schedule regular health, dental, and eye exams. Stay current with your vaccines. Tell your health care provider if: You often feel depressed. You have ever been abused or do not feel safe at home. Summary Adopting a healthy lifestyle and getting preventive care are important in promoting health and wellness. Follow your health care provider's instructions about healthy diet, exercising, and getting tested or screened for diseases. Follow your health care provider's  instructions on monitoring your cholesterol and blood pressure. This information is not intended to replace advice given to you by your health care provider. Make sure you discuss any questions you have with your health care provider. Document Revised: 08/22/2020 Document Reviewed: 06/07/2018 Elsevier Patient Education  2022 Elsevier Inc.  Cc bi t?p cho ?au ??u g?i m?n tnh Exercises for Chronic Knee Pain ?au ??u g?i m?n tnh l ?au ko di h?n 3 thng. ??i v?i h?u h?t nh?ng ng??i b? ?au ??u g?i m?n tnh, t?p th? d?c v gi?m cn l m?t ph?n quan tr?ng c?a qu trnh ?i?u tr?Shaune Pascal gia ch?m so?c s??c kho?e c th? mu?n qu v? t?p trung [place]: T?ng c??ng s?c m?nh cc c? nng ?? ??u g?i c?a qu v?. ?i?u ny c th? lm gi?m p l?c ??t ln ??u g?i v gi?m ?au. Phng ng?a c?ng ??u g?i. Duy tr ho?c t?ng kho?ng cch m qu v? c th? c? ??ng ??u g?i. Gi?m cn (n?u p d?ng) ?? gi?m p l?c ln ??u g?i, gi?m nguy c? t?n th??ng v gip qu v? t?p th? d?c d? dng h?n. Chuyn gia ch?m [place] s?c kh?e s? gip qu v? pht tri?n m?t ch??ng trnh t?p th? d?c  ph h?p v?i cc nhu c?u v kh? n?ng th? ch?t c?a qu v?. D??i ?y l cc bi t?p ??n gi?n, tc ??ng th?p m qu v? c th? th?c hi?n t?i nh. Hy h?i chuyn gia ch?m [place] s?c kh?e ho?c chuyn gia v?t l tr? li?u v? t?n su?t qu v? nn th?c hi?n ch??ng trnh t?p th? d?c v l?p l?i bao nhiu l?n m?i bi t?p. L?i [employer] v? an ton Lm theo cc l?i [employer] an ton sau ?y [place] t?p th? d?c v?i ?au ??u g?i m?n tnh: H?i chuyn gia ch?m [place] s?c kh?e ?? ???c ch?p thu?n tr??c khi th?c hi?n b?t k? bi t?p no. B?t ??u t? t? v d?ng b?t c? lc no vi?c t?p luy?n gy ?au ??n. Khng t?p th? d?c n?u c?n ?au ??u g?i ?ang bng pht. Kh?i ??ng tr??c tin. Gin c? ch?a kh?i ??ng c th? gy ra t?n th??ng. Th?c hi?n 5-10 pht c? ??ng d? dng ho?c gin c? nh? nhng tr??c khi b?t ??u bi t?p. Th?c hi?n 5-10 pht ho?t ??ng tc ??ng th?p (nh? ?i b? ho?c ??p xe) tr??c khi b?t ??u cc  bi t?p t?ng c??ng s?c m?nh. Lin h? v?i chuyn gia ch?m [place] s?c kh?e b?t c? lc no qu v? b? ?au trong ho?c sau khi t?p th? d?c. T?p th? d?c c th? gy kh ch?u nh?ng khng ???c gy ?au ??n. C?m gic h?i c?ng ho?c ?au sau khi t?p th? d?c l bnh th??ng.  Cc bi t?p gin c? v ph?m vi v?n ??ng Gin c? ?i tr??c  ??ng th?ng v nng ?? c? th? b?ng cch v?n [place] gh? ho?c t? m?t tay ln t??ng. Gi? hai chn th?ng v st [place] nhau, g?p m?t ??u g?i ?? nng gt chn ln v? pha mng. Dng m?t tay ?? ??, dng bn tay kia n?m c? chn. Ko bn chn ln g?n h?n v? pha mng ?? c?m nh?n s?c c?ng ? tr??c ?i. Gi? cho c? c?ng trong 30 giy. L?p l?i __________ l?n. Hon t?t bi t?p ny __________ l?n m?i ngy. Gin c? ?i sau  Ng?i trn sn nh v?i l?ng th?ng v hai chn du?i th?ng ra pha tr??c. ??t hai lng bn tay ln sn v tr??t v? pha hai bn chn khi qu v? g?p hng. C? g?ng ch?m m?i [place] ??u g?i v c?m th?y c?ng c? ? m?t sau hai bn ?i. Gi? trong 30 giy. L?p l?i __________ l?n. Hon t?t bi t?p ny __________ l?n m?i ngy. Gin c? b?p chn  ??ng ??i di?n v?i m?t b?c t??ng. ??t hai lng bn tay t? b?ng ph?ng trn t??ng, cnh tay m? r?ng v h?i ng[place] ng??i v? pha t??ng. ??ng ? t? th? chng chn v?i m?t chn g?p l?i ? ??u g?i v chn kia du?i th?ng ra pha sau qu v?. Gi? c? hai bn chn quay [place] t??ng v t?ng m?c ?? g?p ? ??u g?i trong khi gi? cho gt c?a chn kia b?ng ph?ng trn m?t ??t. Qu v? c?n c?m th?y c?ng c? ? b?p chn. Gi? trong 30 giy. L?p l?i __________ l?n. Hon t?t bi t?p ny __________ l?n m?i ngy. Cc bi t?p t?ng c??ng s?c m?nh Nng chn th?ng N?m ng?a v?i m?t ??u g?i g?p l?i v chn kia du?i th?ng. T? t? nng chn th?ng ln m khng g?p ??u g?i. Nng cho ??n khi bn chn cao h?n sn kho?ng 12 inch (30 cm). Gi? trong 3-5 giy v  t? t? h? th?p chn. L?p l?i __________ l?n. Hon t?t bi t?p ny __________ l?n m?i ngy. Nhn m?t chn ??ng gi?a hai chi?c gh? v ??t c? hai  bn tay ln l?ng gh? ?? lm ?i?m t?a. Du?i th?ng m?t chn, ?? tr?ng l??ng c? th? d?n trn gt chn ?ang ??ng. T? t? g?p ??u g?i c?a chn ?ang ??ng ?? nhn ng??i xu?ng m?c m qu v? th?y tho?i mi. Gi? trong 3-5 giy. L?p l?i __________ l?n. Hon t?t bi t?p ny __________ l?n m?i ngy. Cu?n t? ?i sau ??ng th?ng, hai ??u g?i st [place] nhau, quay m?t v? pha l?ng c?a m?t chi?c gh?. Dng c? hai tay ?? bm [place]ley l?ng gh?Sandria Manly? m?t chn th?ng. G?p ??u g?i cn l?i trong khi ??a gt chn ln pha mng cho ??n khi ??u g?i ???c g?p ? gc 90 ?? (gc ph?i). Gi? trong 3-5 giy. L?p l?i __________ l?n. Hon t?t bi t?p ny __________ l?n m?i ngy. T?a l?ng [place] t??ng ??ng th?ng v?i l?ng, hng v ??u t?a [place] t??ng. B??c v? pha tr??c m?t b??c m?i l?n trong khi l?ng v?n t?a [place] t??ng. Chn c?a qu v? ph?i cch t??ng 2 feet (61 cm) ? chi?u r?ng b?ng vai. Gi? l?ng, hng v ??u t?a [place] t??ng, tr??t xu?ng t??ng ??n cng g?n t? th? ng?i cng t?t. Gi? trong 5-10 giy, sau ? t? t? tr??t ng??c ln. L?p l?i __________ l?n. Hon t?t bi t?p ny __________ l?n m?i ngy. B??c ln B??c b?ng m?t chn ln m?t chi?c b? c?ng ho?c gh? cao kho?ng 6 inch (15 cm). Kathee Polite v? m?t bn v?i m?t chn trn b? ?? v m?t chn trn m?t ??t. D?n t?t c? tr?ng l??ng ln bn chn trn b? ?? v nng c? th? kh?i m?t ??t cho ??n khi ??u g?i du?i ra. ?? chn kia th? tho?i mi sang bn. Gi? trong 3-5 giy sau ? t? t? h? tr?ng l??ng xu?ng bn chn ??t trn sn. L?p l?i __________ l?n. Hon t?t bi t?p ny __________ l?n m?i ngy. Hy lin l?c v?i chuyn gia ch?m [place] s?c kh?e n?u: Bi t?p th? d?c c?a qu v? gy ?au. ?au tr?m tr?ng h?n sau khi t?p th? d?c. ?au khi?n qu v? khng th? t?p th? d?c. Thng tin ny khng nh?m m?c ?ch thay th? cho l?i khuyn m chuyn gia ch?m [place] s?c kh?e ni v?i qu v?. Hy b?o ??m qu v? ph?i th?o lu?n b?t k? v?n ?? g m qu v? c v?i chuyn gia ch?m [place] s?c kh?e c?a qu v?. Document Revised: 07/18/2019 Document  Reviewed: 07/18/2019 Elsevier Patient Education  2022 Elsevier Inc.  B?nh ti?u ???ng tup 2, ch?n ?oa?n, ng??i l?n Type 2 Diabetes Mellitus, Diagnosis, Adult Ti?u ????ng tup 2 (b?nh ti?u ???ng tup 2) la? m?t b?nh ke?o da?i hay ma?n ti?nh. Trong ti?u ????ng tup 2, m?t ho??c ca? hai v?n ?? sau co? th? xa?y ra: Tu?y khng ta?o ra ?u? m?t lo?i hc mn c tn la? insulin. Ca?c t? ba?o trong c? th? khng ?a?p ??ng thch h??p v??i insulin ma? c? th? ta?o ra (kha?ng insulin). Thng th???ng, insulin cho phe?p ???ng trong mu (glucose) ?i va?o cc t? ba?o trong c? th?. Nh??ng t? ba?o na?y s?? du?ng glucose ?? ta?o ra n?ng l???ng. Ti?nh tra?ng kha?ng insulin ho??c thi?u h?t insulin lm cho glucose d? th?a tch t? trong mu thay v ?i [place] trong cc t? bo. ?i?u ny gy ra ???ng huy?t cao (  t?ng ???ng huy?t).  Nguyn nhn g gy ra? Khng r nguyn nhn chnh xc gy ti?u ????ng tup 2. ?i?u g lm t?ng nguy c?? Nh?ng y?u t? sau c th? khi?n cho qu v? d? b? tnh tr?ng ny h?n: C m?t ng??i trong gia ?nh b? ti?u ???ng tup 2. Th?a cn ho?c bo ph. t ho?t ??ng (t?nh t?i). ? c ch?n ?on l khng insulin. Co? ti?n s?? bi? ti?n ti?u ????ng, ti?u ????ng khi mang thai (ti?u ???ng [place] k?), ho?c h?i ch?ng bu?ng tr?ng ?a nang (PCOS). C cc d?u hi?u ho?c tri?u ch?ng g? Trong giai ?oa?n s??m cu?a tnh tr?ng na?y, qu v? co? th? khng co? tri?u ch??ng. Cc tri?u ch?ng pht tri?n ch?m v c th? bao g?m: T?ng kht n??c ho?c ?i. T?ng ti?u ti?n. S?t cn khng r nguyn nhn. M?t m?i (m?t m?i) ho?c suy nh??c. Thay ??i th? l?c, ch?ng h?n nh? nhn m?. Cc m?ng s?m mu trn da. Ch?n ?on tnh tr?ng ny nh? th? no? Tnh tr?ng ny ???c ch?n ?on d?a [place] cc tri?u ch?ng, b?nh s?? cu?a quy? vi?, kha?m th??c th? v n?ng ?? ????ng huy?t cu?a quy? vi?. ???ng huy?t c th? ???c ki?m tra b?ng m?t ho?c nhi?u xt nghi?m mu sau ?y: Xt nghi?m ???ng huy?t lc ?i (fasting blood glucose, FBG).  Qu v? s? khng ???c php ?n (quy? vi? se? nhi?n ?o?i) trong 8 ti?ng tr? ln tr??c khi l?y m?u mu. Xt nghi?m ???ng huy?t ng?u nhin. Xe?t nghi?m na?y ki?m tra ???ng huy?t c?a qu v? b?t k? lc no trong ngy, b?t k? qu v? ?n lc no. Xe?t nghi?m ma?u A1C (hemoglobin A1C). Xt nghi?m na?y cung c?p thng tin v? n?ng ?? ???ng huy?t trong 2-3 thng tr??c ?. Xt nghi?m dung n?p glucose ???ng u?ng (OGTT). Xe?t nghi?m na?y ?o l???ng ????ng huy?t cu?a quy? vi? ta?i hai th??i ?i?m: Sau khi nh?n ?i. ?y l n?ng ?? ???ng huy?t ? l?n khm ban ??u c?a qu v?. Hai gi?? sau khi u?ng m?t ?? u?ng co? glucose. Quy? vi? co? th? c ch?n ?oa?n bi? ti?u ????ng tup 2 n?u: N?ng ?? ????ng huy?t lc ?i c?a qu v? t? 126 mg/dL (7.0 mmol/L) tr? ln. N?ng ?? ????ng huy?t ng?u nhin t? 200 mg/dL (76,7 mmol/L) tr? ln. N?ng ?? A1c cu?a quy? vi? t? 6,5% tr? ln. K?t qu? th? nghi?m dung n?p glucose qua ???ng u?ng c?a qu v? cao h?n 200 mg/dL (34.1 mmol/L). Nh??ng xe?t nghi?m ma?u na?y co? th? ????c l??p la?i ?? xa?c ??nh ch?n ?oa?n cu?a quy? vi?. Tnh tr?ng ny ???c ?i?u tr? nh? th? no? Vi?c ?i?u tri? cu?a quy? vi? co? th? do m?t chuyn gia g?i la? ba?c si? chuyn khoa n?i ti?t qua?n ly?Marland Kitchen Ti?u ????ng tup 2 co? th? ????c ?i?u tr? b??ng ca?ch tun th? theo ch? d?n c?a chuyn gia ch?m [place] s?c kh?e v?: Thay ??i ch? ?? ?n va? l?i s?ng. Nh?ng thay ??i ny c th? bao g?m: Tun theo k? hoa?ch dinh d??ng ring do bc s? chuyn khoa dinh d??ng c ??ng k hnh ngh? xy d?ng. T?p th? d?c th??ng xuyn. Tm cc cch qu?n l c?ng th?ng. Ki?m tra l??ng ????ng huy?t cu?a quy? vi? th???ng xuyn theo chi? d?n. Du?ng thu?c ?i?u tri? ti?u ????ng ho??c insulin hng nga?y. Vi?c na?y giu?p gi?? n?ng ?? ????ng huy?t cu?a quy? vi? n??m trong pha?m vi t?t cho s?c kh?e. Du?ng thu?c ?? giu?p phng ng?a cc bi?n ch??ng cu?a ti?u ????ng. Thu?c c  th? bao g?m: Aspirin. Thu?c lm gia?m  cholesterol. Thu?c ki?m sot huy?t p. Chuyn gia ch?m so?c s??c kho?e cu?a qu v? co? th? ???t ca?c mu?c tiu ?i?u tri? cho qu v?. Cc mu?c tiu se? d??a va?o tu?i, ca?c ti?nh tra?ng b?nh ly? khc cu?a quy? vi? va? quy? vi? ?a?p ??ng nh? th? na?o v??i ?i?u tri? b?nh ti?u ????ng. Nhn chung, mu?c tiu ?i?u tri? la? duy tri? n?ng ?? ???ng huy?t nh? sau: Tr???c b??a ?n chnh: 80-130 mg/dL (9.9-2.4 mmol/L). Sau b??a ?n chnh: d???i 180 mg/dL (10 mmol/L). N?ng ?? A1c: d??i 7%. Tun th? nh?ng h??ng d?n ny ? nh: Hy h?i chuyn gia ch?m [place] s?c kh?e c?a qu v? Cn nh??c ho?i nh??ng cu sau: Ti c nn g?p m?t chuyn gia gio d?c v ch?m [place] b?nh ti?u ???ng c ch?ng nh?n khng? Ti c?n loa?i thu?c no ?i?u tri? ti?u ????ng va? khi na?o ti c?n du?ng cc thu?c ?? Ti se? c?n thi?t bi? gi? ?? qua?n ly? ti?u ????ng ?? nha?? Ti c?n ki?m tra ????ng huy?t bao lu m?t l?n? Ti co? th? ti?m m?t nho?m h? tr?? ng???i bi? ti?u ????ng ?? ?u? Ti co? th? go?i cho s? na?o n?u ti co? th?c m?c? Cu?c he?n ti?p theo cu?a ti la? khi na?o? H??ng d?n chung Ch? s? d?ng thu?c khng k ??n v thu?c k ??n theo ch? d?n c?a chuyn gia ch?m [place] s?c kh?e. Tun th? theo t?t c? cc l?n khm l?i. ?i?u ny c vai tr quan tr?ng. N?i ?? tm thm thng tin ?? ???c tr? gip v h??ng d?n v ?? bi?t thm thng tin v? b?nh ti?u ???ng, vui lng truy c?p: American Diabetes Association (ADA) (Hi?p h?i Ti?u ????ng My?): www.diabetes.org Americal Association of Diabetes Care and Education Specialists (ADCES) (Hi?p h?i Chuyn gia Gio d?c v Ch?m [place] B?nh ti?u ???ng M?): diabeteseducator.org International Diabetes Federation (IDF) Juel Burrow ?on Ti?u ???ng Qu?c t?): [email or web address] Hy lin l?c v?i chuyn gia ch?m [place] s?c kh?e n?u: L??ng ???ng huy?t c?a qu v? t? 240 mg/dL (26,8 mmol/L) tr? ln trong 2 nga?y lin tu?c. Qu v? ? b? ?m ho?c b? s?t trong 2 ngy tr? ln v khng ??. Quy? vi? co? b?t ky? v?n  ?? na?o sau ?y trong h?n 6 gi??: Qu v? khng th? ?n ho?c khng th? u?ng. Qu v? b? bu?n nn v nn. Qu v? b? tiu ch?y. Yu c?u tr? gip ngay l?p t?c n?u: Qu v? b? h? ???ng huy?t n?ng. ?i?u ny c ngh?a l ????ng huy?t cu?a quy? vi? d???i 54 mg/dL (3,0 mmol/L). Quy? vi? b? lu? l?n ho??c quy? vi? suy nghi? khng ro? ra?ng. Qu v? b? kh th?. Qu v? c l??ng [place] trong n???c ti?u ? m?c trung bnh ho??c cao. Nh?ng tri?u ch?ng ny c th? l bi?u hi?n c?a m?t v?n ?? nghim tr?ng c?n c?p c?u. Khng ch? xem tri?u ch?ng c h?t khng. Hy ?i khm ngay l?p t?c. G?i cho d?ch v? c?p c?u t?i ??a ph??ng (911 ? [place] Khng t? li xe ??n b?nh vi?n. Tm t?t B?nh ti?u ????ng tup 2 la? m?t b?nh ke?o da?i ho?c ma?n ti?nh. Trong b?nh ti?u ???ng tup 2, t?y khng s?n sinh ?? m?t hc mn c tn l insulin, ho?c khi cc t? bo trong c? th? khng ?p ?ng thch ?ng v?i insulin m c? th? t?o ra. Tnh tr?ng ny ???c ?i?u tr? b?ng cch th?c hi?n cc thay ??i ch? ?? ?n v l?i s?ng v  dng thu?c tr? ti?u ???ng ho?c insulin. Chuyn gia ch?m so?c s??c kho?e cu?a qu v? co? th? ???t ca?c mu?c tiu ?i?u tri? cho qu v?. Cc mu?c tiu se? d??a va?o tu?i, ca?c ti?nh tra?ng b?nh ly? khc cu?a quy? vi? va? quy? vi? ?a?p ??ng nh? th? na?o v??i ?i?u tri? b?nh ti?u ????ng. Tun th? theo t?t c? cc l?n khm l?i. ?i?u ny c vai tr quan tr?ng. Thng tin ny khng nh?m m?c ?ch thay th? cho l?i khuyn m chuyn gia ch?m [place] s?c kh?e ni v?i qu v?. Hy b?o ??m qu v? ph?i th?o lu?n b?t k? v?n ?? g m qu v? c v?i chuyn gia ch?m [place] s?c kh?e c?a qu v?. Document Revised: 10/20/2020 Document Reviewed: 10/20/2020 Elsevier Patient Education  2022 [employer].

## 2021-03-13 LAB — CBC WITH DIFFERENTIAL/PLATELET
Basophils Absolute: 0.1 10*3/uL (ref 0.0–0.2)
Basos: 1 %
EOS (ABSOLUTE): 0.1 10*3/uL (ref 0.0–0.4)
Eos: 3 %
Hematocrit: 40.2 % (ref 34.0–46.6)
Hemoglobin: 12.6 g/dL (ref 11.1–15.9)
Immature Grans (Abs): 0 10*3/uL (ref 0.0–0.1)
Immature Granulocytes: 0 %
Lymphocytes Absolute: 2.8 10*3/uL (ref 0.7–3.1)
Lymphs: 50 %
MCH: 20.8 pg — ABNORMAL LOW (ref 26.6–33.0)
MCHC: 31.3 g/dL — ABNORMAL LOW (ref 31.5–35.7)
MCV: 66 fL — ABNORMAL LOW (ref 79–97)
Monocytes Absolute: 0.5 10*3/uL (ref 0.1–0.9)
Monocytes: 8 %
Neutrophils Absolute: 2.1 10*3/uL (ref 1.4–7.0)
Neutrophils: 38 %
Platelets: 331 10*3/uL (ref 150–450)
RBC: 6.07 x10E6/uL — ABNORMAL HIGH (ref 3.77–5.28)
RDW: 14.3 % (ref 11.7–15.4)
WBC: 5.6 10*3/uL (ref 3.4–10.8)

## 2021-03-13 LAB — LIPID PANEL
Chol/HDL Ratio: 4.3 ratio (ref 0.0–4.4)
Cholesterol, Total: 209 mg/dL — ABNORMAL HIGH (ref 100–199)
HDL: 49 mg/dL (ref >39)
LDL Chol Calc (NIH): 130 mg/dL — ABNORMAL HIGH (ref 0–99)
Triglycerides: 171 mg/dL — ABNORMAL HIGH (ref 0–149)
VLDL Cholesterol Cal: 30 mg/dL (ref 5–40)

## 2021-03-13 LAB — TSH: TSH: 2.02 u[IU]/mL (ref 0.450–4.500)

## 2021-03-13 LAB — COMP. METABOLIC PANEL (12)
AST: 28 IU/L (ref 0–40)
Albumin/Globulin Ratio: 1.6 (ref 1.2–2.2)
Albumin: 4.5 g/dL (ref 3.8–4.9)
Alkaline Phosphatase: 71 IU/L (ref 44–121)
BUN/Creatinine Ratio: 19 (ref 9–23)
BUN: 11 mg/dL (ref 6–24)
Bilirubin Total: 0.4 mg/dL (ref 0.0–1.2)
Calcium: 9.2 mg/dL (ref 8.7–10.2)
Chloride: 102 mmol/L (ref 96–106)
Creatinine, Ser: 0.57 mg/dL (ref 0.57–1.00)
Globulin, Total: 2.9 g/dL (ref 1.5–4.5)
Glucose: 103 mg/dL — ABNORMAL HIGH (ref 65–99)
Potassium: 3.8 mmol/L (ref 3.5–5.2)
Sodium: 141 mmol/L (ref 134–144)
Total Protein: 7.4 g/dL (ref 6.0–8.5)
eGFR: 107 mL/min/{1.73_m2} (ref >59)

## 2021-03-13 LAB — HEPATITIS C ANTIBODY: Hep C Virus Ab: 0.5 s/co ratio (ref 0.0–0.9)

## 2021-03-13 LAB — MICROALBUMIN, URINE: Microalbumin, Urine: <3 ug/mL

## 2021-04-17 ENCOUNTER — Ambulatory Visit: Payer: BC Managed Care – PPO

## 2021-05-19 ENCOUNTER — Ambulatory Visit: Payer: Self-pay

## 2021-06-25 ENCOUNTER — Ambulatory Visit: Payer: Self-pay

## 2021-07-16 ENCOUNTER — Ambulatory Visit
Admission: RE | Admit: 2021-07-16 | Discharge: 2021-07-16 | Disposition: A | Discharge status: Home / Self Care | Payer: BC Managed Care – PPO | Source: Ambulatory Visit | Attending: Nurse Practitioner | Admitting: Nurse Practitioner

## 2021-07-16 DIAGNOSIS — Z1231 Encounter for screening mammogram for malignant neoplasm of breast: Secondary | ICD-10-CM

## 2021-09-10 ENCOUNTER — Ambulatory Visit: Payer: BC Managed Care – PPO | Admitting: Nurse Practitioner

## 2023-12-19 IMAGING — MG MM DIGITAL SCREENING BILAT W/ TOMO AND CAD
6 of 12 series · 6 of 36 positions shown · non-contrast
Comparison: Previous exam(s).

CLINICAL DATA: Screening.

EXAM:
DIGITAL SCREENING BILATERAL MAMMOGRAM WITH TOMOSYNTHESIS AND CAD
TECHNIQUE: Bilateral screening digital craniocaudal and mediolateral oblique
mammograms were obtained. Bilateral screening digital breast
tomosynthesis was performed. The images were evaluated with
computer-aided detection.

[R CC synth-2D]
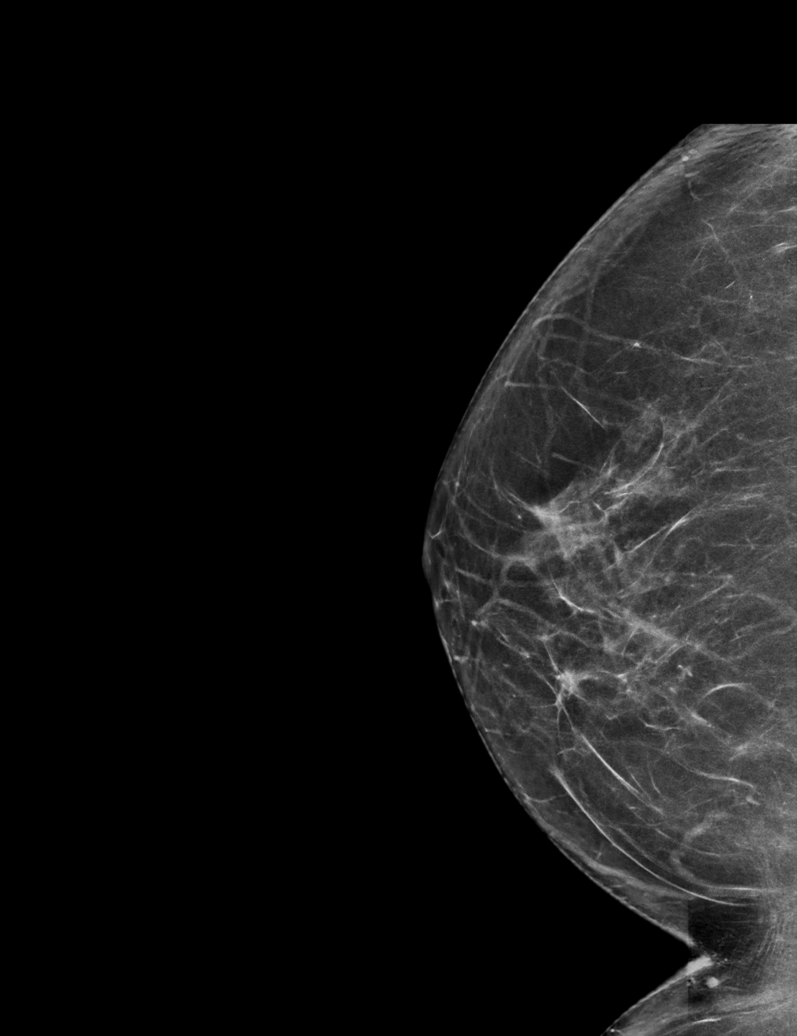

[L MLO synth-2D (1 of 2)]
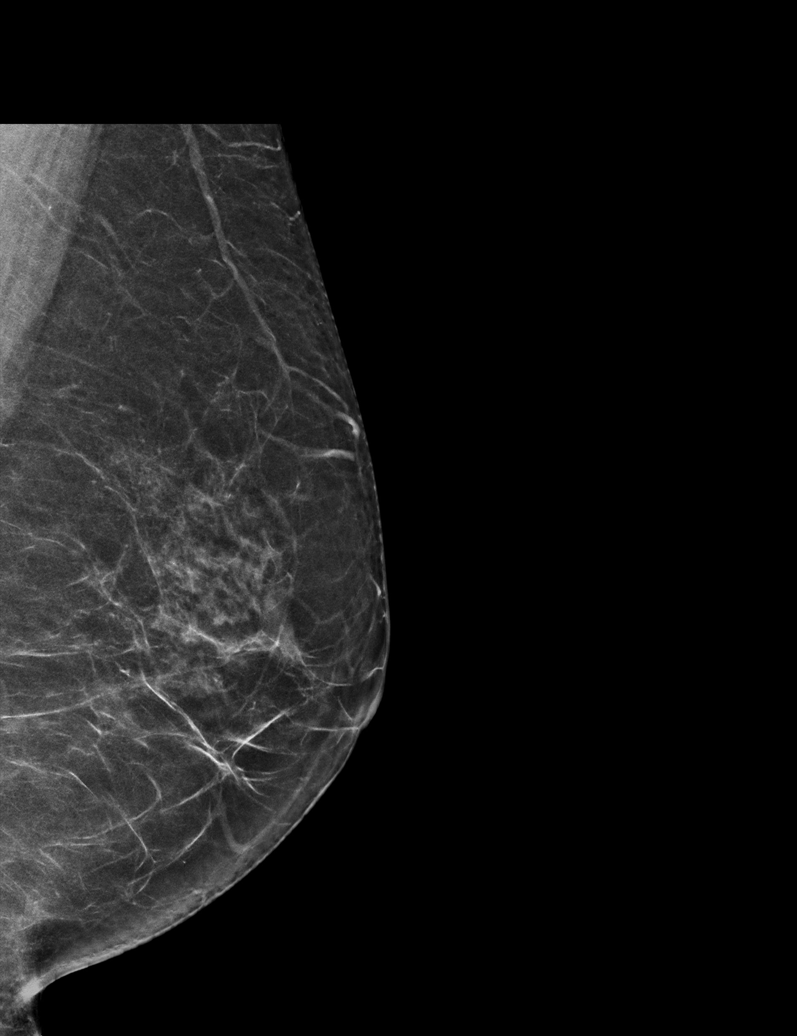

[L MLO synth-2D (2 of 2)]
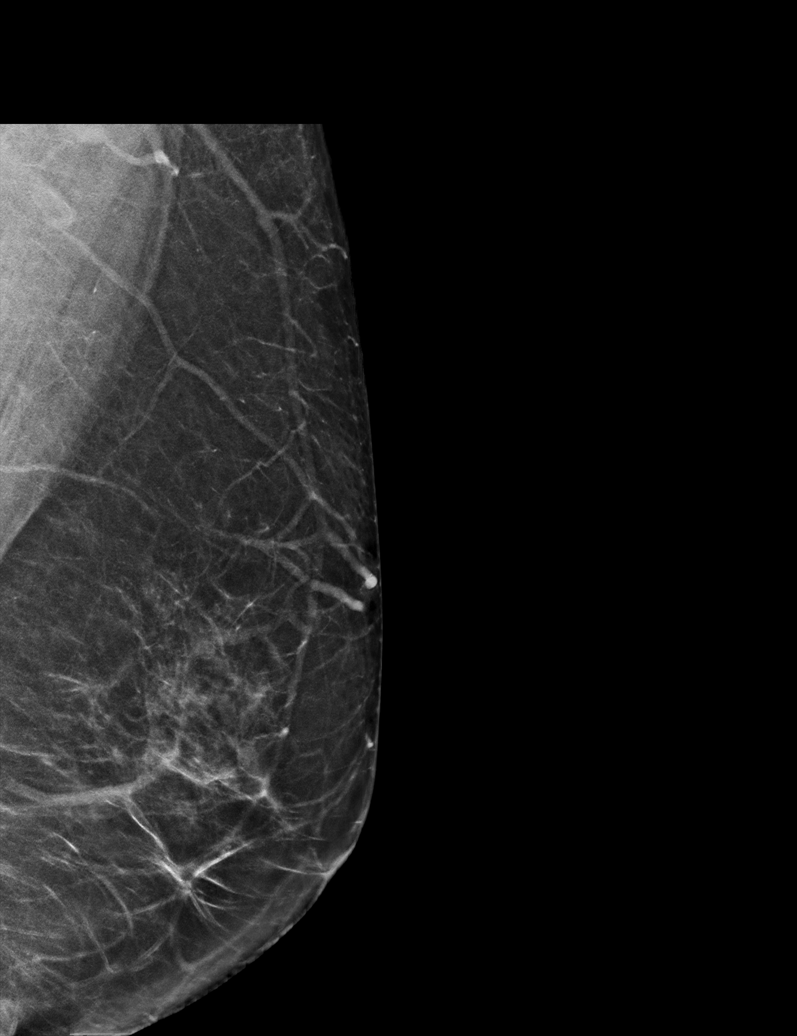

[L CC synth-2D]
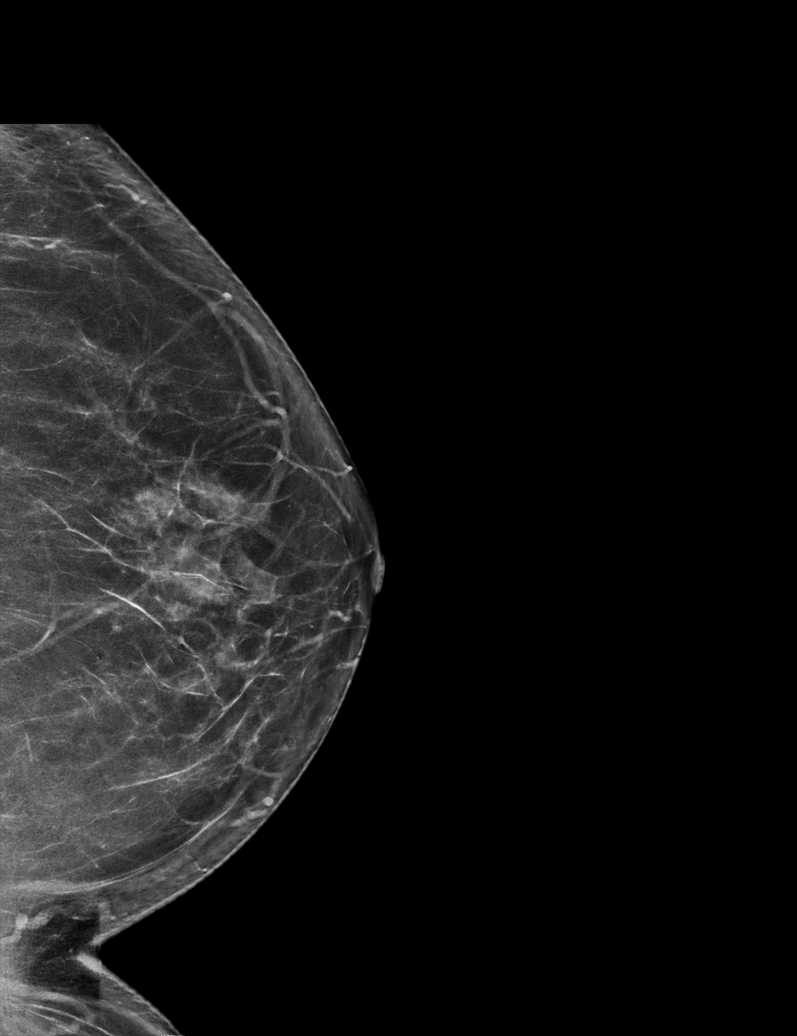

[R MLO synth-2D (1 of 2)]
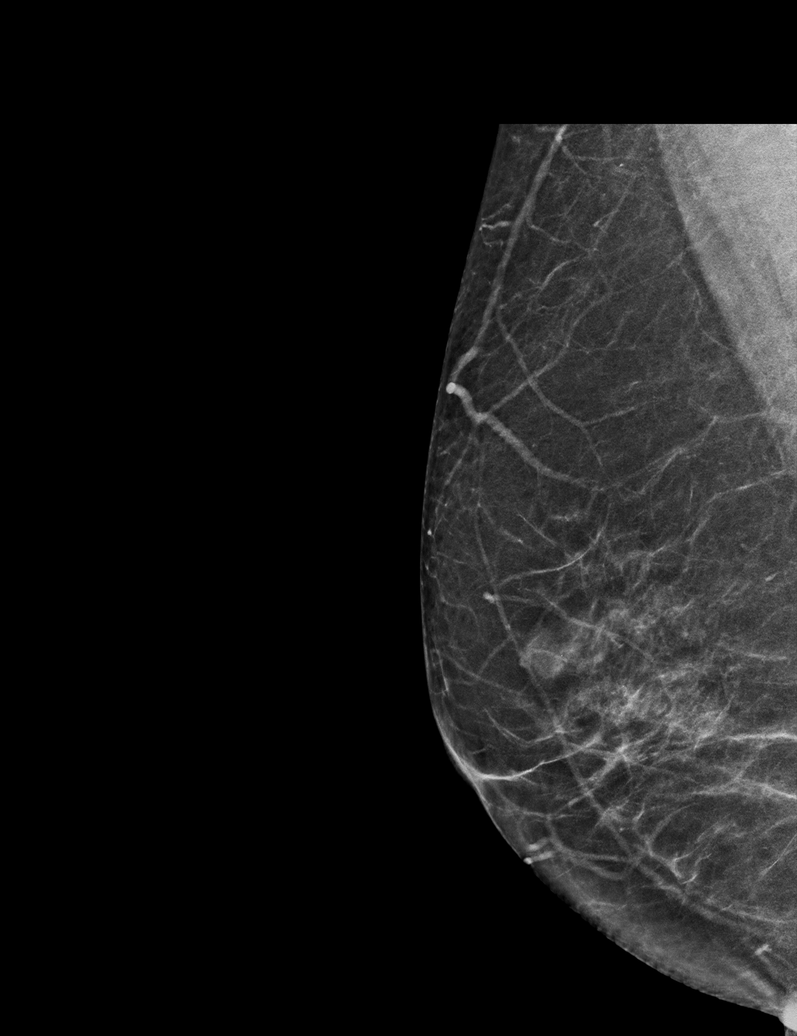

[R MLO synth-2D (2 of 2)]
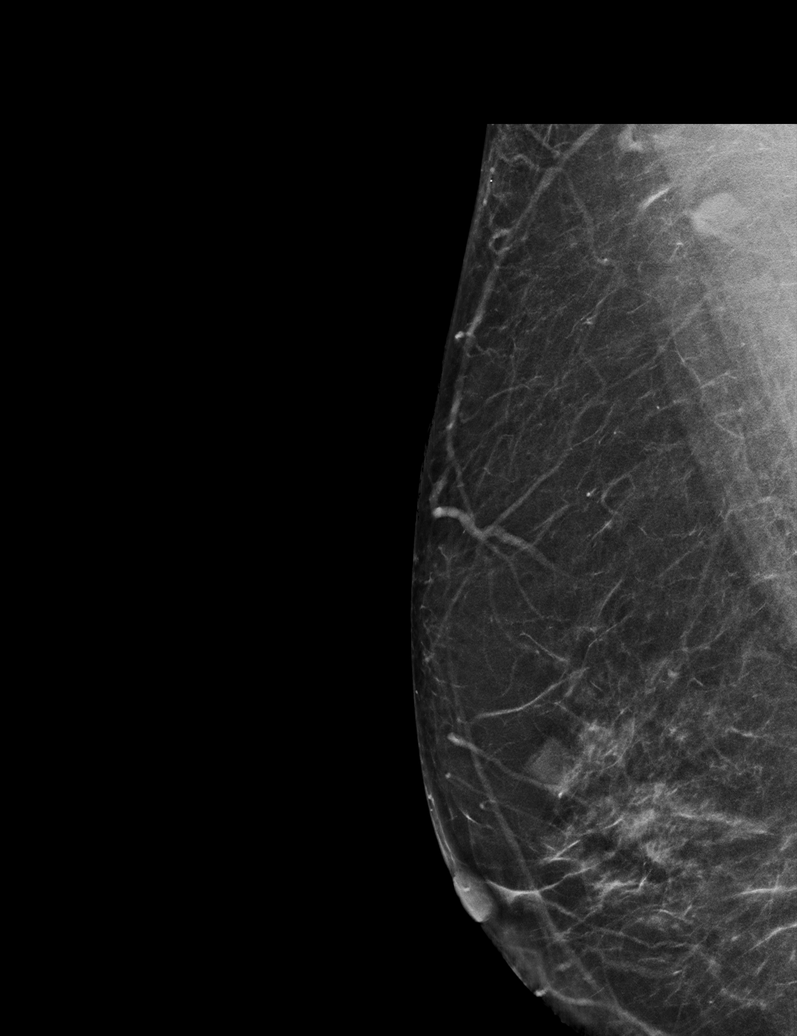

[6 of 36 positions shown; findings below may reference images not displayed]

ACR Breast Density Category b: There are scattered areas of
fibroglandular density.
FINDINGS: There are no findings suspicious for malignancy.
IMPRESSION: No mammographic evidence of malignancy. A result letter of this
screening mammogram will be mailed directly to the patient.

RECOMMENDATION:
Screening mammogram in one year. (Code:51-O-LD2)

BI-RADS CATEGORY  1: Negative.
